# Patient Record
Sex: Female | Born: 1965 | Race: White | Hispanic: No | Marital: Married | Smoking: Never smoker
Health system: Southern US, Community
[De-identification: ages and names within clinical notes are randomized; demographics above are authoritative.]

## PROBLEM LIST (undated history)

## (undated) ENCOUNTER — Inpatient Hospital Stay (HOSPITAL_COMMUNITY): Payer: Self-pay

## (undated) DIAGNOSIS — F32A Depression, unspecified: Secondary | ICD-10-CM

## (undated) DIAGNOSIS — F319 Bipolar disorder, unspecified: Secondary | ICD-10-CM

## (undated) DIAGNOSIS — O24419 Gestational diabetes mellitus in pregnancy, unspecified control: Secondary | ICD-10-CM

## (undated) DIAGNOSIS — R55 Syncope and collapse: Secondary | ICD-10-CM

## (undated) DIAGNOSIS — G43909 Migraine, unspecified, not intractable, without status migrainosus: Secondary | ICD-10-CM

## (undated) DIAGNOSIS — R519 Headache, unspecified: Secondary | ICD-10-CM

## (undated) DIAGNOSIS — F329 Major depressive disorder, single episode, unspecified: Secondary | ICD-10-CM

## (undated) DIAGNOSIS — D649 Anemia, unspecified: Secondary | ICD-10-CM

## (undated) DIAGNOSIS — E039 Hypothyroidism, unspecified: Secondary | ICD-10-CM

## (undated) DIAGNOSIS — R51 Headache: Secondary | ICD-10-CM

## (undated) HISTORY — DX: Anemia, unspecified: D64.9

## (undated) HISTORY — DX: Syncope and collapse: R55

## (undated) HISTORY — DX: Migraine, unspecified, not intractable, without status migrainosus: G43.909

## (undated) HISTORY — DX: Hypothyroidism, unspecified: E03.9

## (undated) HISTORY — DX: Gestational diabetes mellitus in pregnancy, unspecified control: O24.419

## (undated) HISTORY — DX: Major depressive disorder, single episode, unspecified: F32.9

## (undated) HISTORY — PX: NO PAST SURGERIES: SHX2092

## (undated) HISTORY — DX: Bipolar disorder, unspecified: F31.9

## (undated) HISTORY — DX: Headache: R51

## (undated) HISTORY — DX: Depression, unspecified: F32.A

## (undated) HISTORY — PX: OTHER SURGICAL HISTORY: SHX169

## (undated) HISTORY — DX: Headache, unspecified: R51.9

---

## 1997-10-25 ENCOUNTER — Emergency Department (HOSPITAL_COMMUNITY): Admission: EM | Admit: 1997-10-25 | Discharge: 1997-10-25 | Payer: Self-pay | Admitting: Emergency Medicine

## 1997-12-12 ENCOUNTER — Emergency Department (HOSPITAL_COMMUNITY): Admission: EM | Admit: 1997-12-12 | Discharge: 1997-12-12 | Payer: Self-pay | Admitting: Emergency Medicine

## 1998-01-13 ENCOUNTER — Emergency Department (HOSPITAL_COMMUNITY): Admission: EM | Admit: 1998-01-13 | Discharge: 1998-01-13 | Payer: Self-pay

## 1998-02-23 ENCOUNTER — Emergency Department (HOSPITAL_COMMUNITY): Admission: EM | Admit: 1998-02-23 | Discharge: 1998-02-23 | Payer: Self-pay | Admitting: Emergency Medicine

## 1998-04-17 ENCOUNTER — Emergency Department (HOSPITAL_COMMUNITY): Admission: EM | Admit: 1998-04-17 | Discharge: 1998-04-17 | Payer: Self-pay | Admitting: Emergency Medicine

## 1998-06-08 ENCOUNTER — Emergency Department (HOSPITAL_COMMUNITY): Admission: EM | Admit: 1998-06-08 | Discharge: 1998-06-08 | Payer: Self-pay | Admitting: Emergency Medicine

## 1998-07-26 ENCOUNTER — Emergency Department (HOSPITAL_COMMUNITY): Admission: EM | Admit: 1998-07-26 | Discharge: 1998-07-26 | Payer: Self-pay | Admitting: Emergency Medicine

## 1998-07-30 ENCOUNTER — Encounter (HOSPITAL_COMMUNITY): Admission: RE | Admit: 1998-07-30 | Discharge: 1998-10-28 | Payer: Self-pay | Admitting: Neurology

## 1999-01-11 ENCOUNTER — Other Ambulatory Visit: Admission: RE | Admit: 1999-01-11 | Discharge: 1999-01-11 | Payer: Self-pay | Admitting: *Deleted

## 1999-03-07 ENCOUNTER — Emergency Department (HOSPITAL_COMMUNITY): Admission: EM | Admit: 1999-03-07 | Discharge: 1999-03-07 | Payer: Self-pay | Admitting: Emergency Medicine

## 1999-04-04 ENCOUNTER — Emergency Department (HOSPITAL_COMMUNITY): Admission: EM | Admit: 1999-04-04 | Discharge: 1999-04-04 | Payer: Self-pay | Admitting: Emergency Medicine

## 1999-07-14 ENCOUNTER — Emergency Department (HOSPITAL_COMMUNITY): Admission: EM | Admit: 1999-07-14 | Discharge: 1999-07-14 | Payer: Self-pay | Admitting: Emergency Medicine

## 1999-10-20 ENCOUNTER — Emergency Department (HOSPITAL_COMMUNITY): Admission: EM | Admit: 1999-10-20 | Discharge: 1999-10-21 | Payer: Self-pay | Admitting: Emergency Medicine

## 1999-11-14 ENCOUNTER — Emergency Department (HOSPITAL_COMMUNITY): Admission: EM | Admit: 1999-11-14 | Discharge: 1999-11-14 | Payer: Self-pay | Admitting: Emergency Medicine

## 1999-11-14 ENCOUNTER — Encounter: Payer: Self-pay | Admitting: Emergency Medicine

## 2000-02-01 ENCOUNTER — Other Ambulatory Visit: Admission: RE | Admit: 2000-02-01 | Discharge: 2000-02-01 | Payer: Self-pay | Admitting: *Deleted

## 2000-02-28 ENCOUNTER — Emergency Department (HOSPITAL_COMMUNITY): Admission: EM | Admit: 2000-02-28 | Discharge: 2000-02-28 | Payer: Self-pay | Admitting: Emergency Medicine

## 2000-05-31 ENCOUNTER — Emergency Department (HOSPITAL_COMMUNITY): Admission: EM | Admit: 2000-05-31 | Discharge: 2000-05-31 | Payer: Self-pay | Admitting: Emergency Medicine

## 2000-07-02 ENCOUNTER — Emergency Department (HOSPITAL_COMMUNITY): Admission: EM | Admit: 2000-07-02 | Discharge: 2000-07-02 | Payer: Self-pay | Admitting: Emergency Medicine

## 2000-08-10 ENCOUNTER — Emergency Department (HOSPITAL_COMMUNITY): Admission: EM | Admit: 2000-08-10 | Discharge: 2000-08-10 | Payer: Self-pay | Admitting: Emergency Medicine

## 2000-08-24 ENCOUNTER — Emergency Department (HOSPITAL_COMMUNITY): Admission: EM | Admit: 2000-08-24 | Discharge: 2000-08-24 | Payer: Self-pay | Admitting: Emergency Medicine

## 2000-09-30 ENCOUNTER — Emergency Department (HOSPITAL_COMMUNITY): Admission: EM | Admit: 2000-09-30 | Discharge: 2000-09-30 | Payer: Self-pay | Admitting: Emergency Medicine

## 2000-12-19 ENCOUNTER — Emergency Department (HOSPITAL_COMMUNITY): Admission: EM | Admit: 2000-12-19 | Discharge: 2000-12-19 | Payer: Self-pay | Admitting: Emergency Medicine

## 2001-07-02 ENCOUNTER — Emergency Department (HOSPITAL_COMMUNITY): Admission: EM | Admit: 2001-07-02 | Discharge: 2001-07-03 | Payer: Self-pay

## 2002-10-07 ENCOUNTER — Emergency Department (HOSPITAL_COMMUNITY): Admission: EM | Admit: 2002-10-07 | Discharge: 2002-10-08 | Payer: Self-pay | Admitting: Emergency Medicine

## 2003-01-25 ENCOUNTER — Emergency Department (HOSPITAL_COMMUNITY): Admission: EM | Admit: 2003-01-25 | Discharge: 2003-01-25 | Payer: Self-pay | Admitting: Emergency Medicine

## 2003-08-08 ENCOUNTER — Emergency Department (HOSPITAL_COMMUNITY): Admission: EM | Admit: 2003-08-08 | Discharge: 2003-08-09 | Payer: Self-pay | Admitting: Emergency Medicine

## 2003-08-09 ENCOUNTER — Inpatient Hospital Stay (HOSPITAL_COMMUNITY): Admission: RE | Admit: 2003-08-09 | Discharge: 2003-08-12 | Payer: Self-pay | Admitting: Psychiatry

## 2003-08-13 ENCOUNTER — Other Ambulatory Visit (HOSPITAL_COMMUNITY): Admission: RE | Admit: 2003-08-13 | Discharge: 2003-11-11 | Payer: Self-pay | Admitting: Psychiatry

## 2003-08-21 ENCOUNTER — Emergency Department (HOSPITAL_COMMUNITY): Admission: EM | Admit: 2003-08-21 | Discharge: 2003-08-21 | Payer: Self-pay | Admitting: Emergency Medicine

## 2005-12-17 ENCOUNTER — Emergency Department (HOSPITAL_COMMUNITY): Admission: EM | Admit: 2005-12-17 | Discharge: 2005-12-17 | Payer: Self-pay | Admitting: Emergency Medicine

## 2006-08-27 ENCOUNTER — Emergency Department (HOSPITAL_COMMUNITY): Admission: EM | Admit: 2006-08-27 | Discharge: 2006-08-27 | Payer: Self-pay | Admitting: Emergency Medicine

## 2008-05-22 ENCOUNTER — Ambulatory Visit: Payer: Self-pay | Admitting: Internal Medicine

## 2008-05-22 ENCOUNTER — Observation Stay (HOSPITAL_COMMUNITY): Admission: EM | Admit: 2008-05-22 | Discharge: 2008-05-26 | Payer: Self-pay | Admitting: Emergency Medicine

## 2008-06-13 ENCOUNTER — Telehealth: Payer: Self-pay | Admitting: Internal Medicine

## 2008-06-14 ENCOUNTER — Encounter: Payer: Self-pay | Admitting: Internal Medicine

## 2008-06-26 ENCOUNTER — Ambulatory Visit: Payer: Self-pay | Admitting: Internal Medicine

## 2008-06-26 ENCOUNTER — Encounter: Payer: Self-pay | Admitting: Internal Medicine

## 2008-06-26 DIAGNOSIS — F419 Anxiety disorder, unspecified: Secondary | ICD-10-CM | POA: Insufficient documentation

## 2008-06-26 DIAGNOSIS — F329 Major depressive disorder, single episode, unspecified: Secondary | ICD-10-CM

## 2010-01-20 ENCOUNTER — Ambulatory Visit (HOSPITAL_COMMUNITY)
Admission: RE | Admit: 2010-01-20 | Discharge: 2010-01-20 | Payer: Self-pay | Source: Home / Self Care | Attending: Psychiatry | Admitting: Psychiatry

## 2010-01-21 ENCOUNTER — Inpatient Hospital Stay (HOSPITAL_COMMUNITY)
Admission: AD | Admit: 2010-01-21 | Discharge: 2010-01-25 | Payer: Self-pay | Source: Home / Self Care | Attending: Psychiatry | Admitting: Psychiatry

## 2010-01-25 LAB — COMPREHENSIVE METABOLIC PANEL
ALT: 37 U/L — ABNORMAL HIGH (ref 0–35)
AST: 30 U/L (ref 0–37)
Albumin: 3.6 g/dL (ref 3.5–5.2)
Alkaline Phosphatase: 80 U/L (ref 39–117)
BUN: 13 mg/dL (ref 6–23)
CO2: 22 mEq/L (ref 19–32)
Calcium: 8.4 mg/dL (ref 8.4–10.5)
Chloride: 110 mEq/L (ref 96–112)
Creatinine, Ser: 0.63 mg/dL (ref 0.4–1.2)
GFR calc Af Amer: >60 mL/min (ref >60)
GFR calc non Af Amer: >60 mL/min (ref >60)
Glucose, Bld: 103 mg/dL — ABNORMAL HIGH (ref 70–99)
Potassium: 3.8 mEq/L (ref 3.5–5.1)
Sodium: 140 mEq/L (ref 135–145)
Total Bilirubin: 0.5 mg/dL (ref 0.3–1.2)
Total Protein: 6 g/dL (ref 6.0–8.3)

## 2010-01-25 LAB — CBC
HCT: 37.4 % (ref 36.0–46.0)
Hemoglobin: 12.7 g/dL (ref 12.0–15.0)
MCH: 30.6 pg (ref 26.0–34.0)
MCHC: 34 g/dL (ref 30.0–36.0)
MCV: 90.1 fL (ref 78.0–100.0)
Platelets: 270 10*3/uL (ref 150–400)
RBC: 4.15 MIL/uL (ref 3.87–5.11)
RDW: 12.5 % (ref 11.5–15.5)
WBC: 8.3 10*3/uL (ref 4.0–10.5)

## 2010-01-25 LAB — URINALYSIS, ROUTINE W REFLEX MICROSCOPIC
Bilirubin Urine: NEGATIVE
Hgb urine dipstick: NEGATIVE
Ketones, ur: NEGATIVE mg/dL
Nitrite: NEGATIVE
Protein, ur: NEGATIVE mg/dL
Specific Gravity, Urine: 1.013 (ref 1.005–1.030)
Urine Glucose, Fasting: NEGATIVE mg/dL
Urobilinogen, UA: 0.2 mg/dL (ref 0.0–1.0)
pH: 6.5 (ref 5.0–8.0)

## 2010-01-25 LAB — PREGNANCY, URINE: Preg Test, Ur: NEGATIVE

## 2010-01-25 NOTE — H&P (Addendum)
NAME:  Alyssa [name], [name] NO.:  [id number]  MEDICAL RECORD NO.:  [id number]          PATIENT TYPE:  IPS  LOCATION:  0400                          FACILITY:  [facility]  PHYSICIAN:  Anselm Jungling, MD  DATE OF BIRTH:  [birth year]  DATE OF ADMISSION:  01/21/2010 DATE OF DISCHARGE:                      PSYCHIATRIC ADMISSION ASSESSMENT   TIME:  8:15 a.m.  IDENTIFICATION:  A 45 year old female.  This is a voluntary admission.  HISTORY OF PRESENT ILLNESS:  Second or third [facility] admission for Alyssa [name] [name], who presents complaining of some mood irritability and thinks she needs to have some labs done.  She is not exactly sure why she is here.  Says that she has difficulty remembering to take her medications and also reported that she gets stressed out and had a meltdown at work today and says she cannot go back there.  She is denying any suicidal or homicidal thought.  She had presented accompanied by her fiance, who was concerned about her leaving the house in the middle the night, looking for her 2 children.  However, she has no children.  She claims that she has problems with poor memory, secondary to taking high doses of Topamax for 15 years for migraine headaches.  PAST PSYCHIATRIC HISTORY:  Currently followed as an outpatient by Dr. Archer Asa at [facility].  She has a history of a previous admission for depression at [facility] in 2005.  She reports previously she was on Abilify, which was stopped several months ago.  She is not aware of any intervening hospitalizations.  No history of substance abuse.  SOCIAL HISTORY:  Single Caucasian female, lives with her fiance for the past 3 years and reports a good relationship, he is supportive.  Father and stepmother also live nearby and are supportive.  She has been working at a [employer].  FAMILY HISTORY:  Sister with depression.  MEDICAL HISTORY:  No regular primary care provider.  Medical  problems are none.  MEDICATIONS: 1. Effexor XR 150 mg twice daily, not taking regularly. 2. Amitriptyline 100 mg at bedtime. 3. Sprintec oral contraceptives. 4. Anaprox and Imitrex for migraine headaches.  DRUG ALLERGIES:  KETORALAC, DROPERIDOL, CODEINE.  PHYSICAL EXAM:  Done here on the unit and is noted in the record.  CMET and CBC are normal.  Other basic labs are currently pending.  This is a petite built, normally-developed, well-hydrated female with no abnormal movements and denying any chronic medical conditions.  MENTAL STATUS EXAM:  Fully alert female, pleasant, cooperative, appears in full contact with reality.  Scattered eye contact.  Somewhat irritable affect.  Acts pained and impatient that she has to answer questions or give any type of history.  No evidence of psychosis. Thinking generally logical.  Denying any suicidal or homicidal thought. Cognitively, she is intact and oriented x3.  AXIS I:  Depressive disorder, not otherwise specified. AXIS II:  Deferred. AXIS III:  No diagnosis. AXIS IV:  Recent work conflict and chronic stressors. AXIS V:  Current 48.  Past year not known.  PLAN:  Voluntarily admit her.  Will get in touch and hear from her  family.  Meanwhile, we will continue her Effexor at 150 mg q.a.m., rather than twice daily, since she reports she is not taking it regularly.     Margaret A. Lorin Picket, N.P.   ______________________________ Anselm Jungling, MD    MAS/MEDQ  D:  01/22/2010  T:  01/22/2010  Job:  [id number]  Electronically Signed by Kari Baars N.P. on 01/22/2010 11:41:59 AM Electronically Signed by Geralyn Flash MD on 01/25/2010 10:59:45 AM

## 2010-01-26 ENCOUNTER — Other Ambulatory Visit (HOSPITAL_COMMUNITY)
Admission: RE | Admit: 2010-01-26 | Discharge: 2010-02-02 | Payer: Self-pay | Source: Home / Self Care | Attending: Psychiatry | Admitting: Psychiatry

## 2010-01-26 LAB — URINE DRUGS OF ABUSE SCREEN W ALC, ROUTINE (REF LAB)
Amphetamine Screen, Ur: NEGATIVE
Cocaine Metabolites: NEGATIVE
Ethyl Alcohol: <10 mg/dL (ref <10)
Opiate Screen, Urine: NEGATIVE
Propoxyphene: NEGATIVE

## 2010-01-29 NOTE — Discharge Summary (Addendum)
  NAME:  Alyssa [name] NO.:  [id number]  MEDICAL RECORD NO.:  [id number]         PATIENT TYPE:  BIPS  LOCATION:  0400                          FACILITY:  [facility]  PHYSICIAN:  Alyssa Jungling, MD  DATE OF BIRTH:  1965/07/13  DATE OF ADMISSION:  01/21/2010 DATE OF DISCHARGE:  01/25/2010                              DISCHARGE SUMMARY   IDENTIFYING DATA/REASON FOR ADMISSION:  This was an inpatient psychiatric admission for Alyssa [name], a 45 year old single female who was admitted because of psychotic symptoms.  Please refer to the admission. Further details pertaining to the symptoms, circumstances, and history that led to her hospitalization.  She was given an initial Axis I diagnosis of psychosis, not otherwise specified.  MEDICAL AND LABORATORY:  The patient was medically and physically assessed by the psychiatric nurse practitioner.  She was in good health without active or chronic medical problems.  Urine drug screen was negative.  There were no significant medical issues during her stay.  HOSPITAL COURSE:  The patient was admitted to the adult inpatient psychiatric service.  She presented as a well-nourished, normally- developed adult female who was generally pleasant and cooperative.  She had been brought in after wandering in a confused state in her neighborhood during the evening hours.  She came to [place] as a patient of Dr. Archer [name], psychiatrist, and Alyssa [name], therapist.  She came to [place] on a regimen of Alyssa [name] 150 mg b.i.d. and amitriptyline 100 mg daily.  Initially, she was placed on 1-to-1 staffing because of her level of confusion, but she cleared very quickly.  She remained in the inpatient service for 4 days.  At that time, she was a good participant in the treatment program.  She attended therapeutic groups and activities geared towards helping her acquire a better set of coping skills, a better understanding of her underlying disorder and  dynamics, and the development of an aftercare plan.  The patient, who lives with her fiance, gave [place] permission to contact the fiance.  On the fourth hospital day, the patient and her fiance and the treatment team were in agreement as her appropriateness for discharge and return to outpatient care.  Although the undersigned had placed a call to Alyssa [name], I did not receive a return call from him during her stay.  AFTERCARE:  The patient was to follow-up with Alyssa [name] and with Alyssa [name], times to be determined at the time of this dictation.  The patient was instructed to continue on her same regimen of medications as prior to her admission.     Alyssa Jungling, MD    SPB/MEDQ  D:  01/26/2010  T:  01/26/2010  Job:  [id number]  Electronically Signed by Geralyn Flash MD on 02/08/2010 10:47:53 AM

## 2010-02-03 ENCOUNTER — Other Ambulatory Visit (HOSPITAL_COMMUNITY): Payer: BC Managed Care – PPO | Attending: Psychiatry | Admitting: Psychiatry

## 2010-02-03 DIAGNOSIS — F3289 Other specified depressive episodes: Secondary | ICD-10-CM | POA: Insufficient documentation

## 2010-02-03 DIAGNOSIS — F329 Major depressive disorder, single episode, unspecified: Secondary | ICD-10-CM | POA: Insufficient documentation

## 2010-02-04 ENCOUNTER — Other Ambulatory Visit (HOSPITAL_COMMUNITY): Payer: BC Managed Care – PPO | Admitting: Psychiatry

## 2010-02-05 ENCOUNTER — Other Ambulatory Visit (HOSPITAL_COMMUNITY): Payer: BC Managed Care – PPO | Admitting: Psychiatry

## 2010-02-08 ENCOUNTER — Other Ambulatory Visit (HOSPITAL_COMMUNITY): Payer: BC Managed Care – PPO | Admitting: Psychiatry

## 2010-02-09 ENCOUNTER — Other Ambulatory Visit (HOSPITAL_COMMUNITY): Payer: BC Managed Care – PPO | Admitting: Psychiatry

## 2010-02-10 ENCOUNTER — Other Ambulatory Visit (HOSPITAL_COMMUNITY): Payer: BC Managed Care – PPO | Admitting: Psychiatry

## 2010-02-11 ENCOUNTER — Encounter: Payer: Self-pay | Admitting: Internal Medicine

## 2010-02-11 ENCOUNTER — Other Ambulatory Visit (HOSPITAL_COMMUNITY): Payer: BC Managed Care – PPO | Admitting: Psychiatry

## 2010-02-12 ENCOUNTER — Other Ambulatory Visit (HOSPITAL_COMMUNITY): Payer: BC Managed Care – PPO | Admitting: Psychiatry

## 2010-02-15 ENCOUNTER — Other Ambulatory Visit (HOSPITAL_COMMUNITY): Payer: BC Managed Care – PPO | Admitting: Psychiatry

## 2010-02-16 ENCOUNTER — Other Ambulatory Visit (HOSPITAL_COMMUNITY): Payer: BC Managed Care – PPO | Admitting: Psychiatry

## 2010-02-17 ENCOUNTER — Other Ambulatory Visit (HOSPITAL_COMMUNITY): Payer: BC Managed Care – PPO | Admitting: Psychiatry

## 2010-02-18 ENCOUNTER — Other Ambulatory Visit (HOSPITAL_COMMUNITY): Payer: BC Managed Care – PPO | Admitting: Psychiatry

## 2010-02-19 ENCOUNTER — Other Ambulatory Visit (HOSPITAL_COMMUNITY): Payer: BC Managed Care – PPO | Admitting: Psychiatry

## 2010-02-22 ENCOUNTER — Other Ambulatory Visit (HOSPITAL_COMMUNITY): Payer: BC Managed Care – PPO | Admitting: Psychiatry

## 2010-02-23 ENCOUNTER — Other Ambulatory Visit (HOSPITAL_COMMUNITY): Payer: BC Managed Care – PPO | Admitting: Psychiatry

## 2010-04-13 LAB — RAPID URINE DRUG SCREEN, HOSP PERFORMED
Opiates: NOT DETECTED
Tetrahydrocannabinol: NOT DETECTED

## 2010-04-13 LAB — TSH: TSH: 2.411 u[IU]/mL (ref 0.350–4.500)

## 2010-04-13 LAB — BASIC METABOLIC PANEL
BUN: 17 mg/dL (ref 6–23)
CO2: 28 mEq/L (ref 19–32)
Calcium: 9.2 mg/dL (ref 8.4–10.5)
GFR calc Af Amer: >60 mL/min (ref >60)
GFR calc non Af Amer: >60 mL/min (ref >60)
GFR calc non Af Amer: >60 mL/min (ref >60)
Glucose, Bld: 103 mg/dL — ABNORMAL HIGH (ref 70–99)
Potassium: 3.8 mEq/L (ref 3.5–5.1)
Potassium: 3.9 mEq/L (ref 3.5–5.1)
Sodium: 140 mEq/L (ref 135–145)

## 2010-04-13 LAB — COMPREHENSIVE METABOLIC PANEL
ALT: 17 U/L (ref 0–35)
Alkaline Phosphatase: 83 U/L (ref 39–117)
CO2: 24 mEq/L (ref 19–32)
Chloride: 107 mEq/L (ref 96–112)
GFR calc non Af Amer: >60 mL/min (ref >60)
Glucose, Bld: 97 mg/dL (ref 70–99)
Potassium: 4.1 mEq/L (ref 3.5–5.1)
Sodium: 139 mEq/L (ref 135–145)

## 2010-04-13 LAB — DIFFERENTIAL
Lymphocytes Relative: 25 % (ref 12–46)
Monocytes Absolute: 0.6 10*3/uL (ref 0.1–1.0)
Monocytes Relative: 6 % (ref 3–12)
Neutro Abs: 6 10*3/uL (ref 1.7–7.7)

## 2010-04-13 LAB — CBC
HCT: 39.5 % (ref 36.0–46.0)
Hemoglobin: 14.2 g/dL (ref 12.0–15.0)
Platelets: 264 10*3/uL (ref 150–400)
RBC: 4.5 MIL/uL (ref 3.87–5.11)
RDW: 12.8 % (ref 11.5–15.5)

## 2010-04-13 LAB — TRICYCLICS SCREEN, URINE: TCA Scrn: POSITIVE — AB

## 2010-05-18 NOTE — Consult Note (Signed)
NAME:  Alyssa [name], DERSHEM NO.:  [id number]   MEDICAL RECORD NO.:  [id number]          PATIENT TYPE:  OBV   LOCATION:                               FACILITY:  [facility]   PHYSICIAN:  Antonietta Breach, M.D.  DATE OF BIRTH:  March 31, 1965   DATE OF CONSULTATION:  05/26/2008  DATE OF DISCHARGE:                                 CONSULTATION   Ms. Alyssa [name] is displaying coherent thought process.  She is cooperative  and socially appropriate.  She still has slight feeling on edge;  however, she is oriented completely at all spheres.  Her memory function  is intact.  She has constructive future goals and interests.  Her  concentration is 90% of normal.  She is not having any thoughts of  harming herself or others.  She has no delusions or hallucinations.  She  has no pressured speech.   GROUP REVIEW OF SYSTEMS:  NEUROLOGIC:  No stiffness or other  extrapyramidal signs from the Zyprexa.   MENTAL STATUS EXAMINATION:  Please see the above.  She is alert.  Her  eye contact is good.  Her speech is within normal limits.  Her  concentration is within normal limits.  Attention span is normal.  Her  affect is broad and appropriate.  Her mood is slightly anxious.  Her  fund of knowledge and intelligence are normal.  Thought process is  logical, coherent, goal directed.  No looseness of association.  Thought  content, no thoughts of harming herself or others.  No delusions or  hallucinations.  Her insight is intact.  Her judgment is intact.   Temperature is 98.0, pulse 76, respiratory rate 18, blood pressure  101/65.   ASSESSMENT:  AXIS I:  293.00 delirium not otherwise specified, now clear  and in remission.  AXIS II:  96.80 bipolar disorder, not otherwise specified, stable.   Ms. Alyssa [name] is not at risk to harm herself or others.  She agrees to call  emergency services immediately for any thoughts of harming herself or  others or other psychiatric emergency symptoms.   We undersigned,  reinforced her necessary ongoing treatment including her  maintenance medication and psychiatric followup.   RECOMMENDATIONS:  No change in her Effexor, her antidepression,  antianxiety.   We would continue to titrate her Lamictal by 25 mg per week to the  initial trial dose for long-term stabilization at 100 mg per day.   Ms. Alyssa [name] has been informed regarding Lamictal, understands the risk of  a lethal rash and other side effects and wants to proceed with Lamictal  after comparing it to other mood stabilizers.   It is hoped that she will be able to taper off her Zyprexa with time.  She does understand that the Zyprexa is necessary for now in order to  prevent a regression back into psychosis.  She understands the risk of a  nonreversible movement and weight gain.  She understands that the goal  is to try her off the Zyprexa once the Lamictal is at the point that it  could be effective.  She agrees to  not drive if drowsy.   We would ask the social worker to set her up with a psychiatric  outpatient followup within the first week of discharge.      Antonietta Breach, M.D.  Electronically Signed     JW/MEDQ  D:  06/22/2008  T:  06/23/2008  Job:  [id number]

## 2010-05-18 NOTE — Consult Note (Signed)
NAME:  TABOR, BARTRAM NO.:  [id number]   MEDICAL RECORD NO.:  [id number]          PATIENT TYPE:  OBV   LOCATION:  3730                         FACILITY:  [facility]   PHYSICIAN:  Antonietta Breach, M.D.  DATE OF BIRTH:  [birth year]   DATE OF CONSULTATION:  05/22/2008  DATE OF DISCHARGE:                                 CONSULTATION   REQUESTING PHYSICIAN:  [facility].   REASON FOR CONSULTATION:  Psychosis.   HISTORY OF PRESENT ILLNESS:  Ms. Alyssa [name] is a 45 year old female who  presented to the [facility] with psychosis, confusion, waxing and  waning disorientation along with waxing and waning memory function.   Earlier in the day she barricaded herself in the bathroom to have a  private conversation with no one in the room.  She has been trying to  crawl through windows.   The patient was coherent enough and did have enough insight with  orientation to recognize both her father and mother.  She gave  permission for them to be included in the session to help provide  history and facilitate support.   Ms. Alyssa [name] does have a past psychiatric history of a form of bipolar  disorder.  However, 2 weeks ago she began to display mental symptoms  that involved many symptoms she had never had before.  Over the past 2  weeks she has displayed waxing and waning memory dysfunction,  orientation dysfunction and clouding of consciousness.  She has had  periods of severe expansive and angry mood with vandalism to objects in  the room.  She has demonstrated some pressured speech, increased rate of  thought process as well as some thought disorganization.  She has  demonstrated uncharacteristic visual hallucinations off and on.   She did go to see her pain doctor 2 weeks ago.  At the time she arrived  she was on her standing psychotropic medication which included Topamax  300 mg daily, Effexor XR 300 mg daily and Elavil 100 mg daily.   It was also noted that  she had been receiving Demerol 50 mg tablet.  She  was to take one as needed for headache.  However, she may have taken as  much as six at one time in order to relieve headache.   At her pain physician's visit she was told to begin a slow taper off of  her Topamax over 3 weeks.  She did not begin that taper.  Also her pain  specialist prescribed Depakote.  She did not take the Depakote.   She presented to an outpatient psychiatrist 2 days prior to her  presentation to [facility] (May 19, 2008).  At that time the  psychiatrist told her to start her Depakote (which she had not yet  started) at 500 mg daily.  He also prescribed Lamictal to start 25 mg  p.o. every other day and then proceed with the typical titration.   She has received one dose of 25 mg Lamictal and one dose of 500 mg  Depakote.  She has continued on her Topamax, her amitriptyline and her  Effexor as above.   She does continue with the waxing and waning memory dysfunction,  disorientation.  During the undersigned's visit she does have thought  disorganization mixed with periods of thought coherence.  She has mild  clouding of consciousness.  She is not agitated or combative.  She is  redirectable.  At times she smiles inappropriately.   PAST PSYCHIATRIC HISTORY:  The past medical record is reviewed.  She  does have a history of a psychiatric admission in August of 2005 at the  [facility].  At that time she was complaining of  depression.  She had suicidal ideation.  She was noting that she had  undergone a divorce in 2003.  It was noted at that time that she had  completed a mood questionnaire and that the questionnaire was suggestive  of bipolar disorder.  However, the patient was vehemently denying the  diagnosis.  Her medications at that time on presentation were Wellbutrin  150 mg daily, Lunesta 2 mg q.h.s.  She was discharged on Topamax 100 mg  b.i.d., Wellbutrin XL 300 mg daily, Lunesta 2 mg  q.h.s.  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   Ms. Alyssa [name], as well as her parents combined, give a history of expansive  elevated mood periods that last no longer than 3 days and have occurred  approximately every 6 months starting in her early 34s.  During these  times her speech rate will go up.  Her thought process speed will  increase.  Her energy will rise.  Her mood will become expansive.  She  will have grandiosity and a high sense of entitlement.  She also will  show decreased need for sleep.  She will express delusions that  typically involve things such as her father showing favor to her other  sister.  She has been diagnosed with bipolar disorder.   FAMILY PSYCHIATRIC HISTORY:  There are indirect reports that her father  may have a form of bipolar disorder.  This history was provided by her  fiance to the general medical admitting physician.  There is also a note  in the past medical record that states that her sister has bipolar  disorder.   SOCIAL HISTORY:  She has a sister.  She was very successful with dance  at a young age and attended [employer] initially.  She did have 2  years of college then her illness progressed.  She has worked as a Social worker  at times.  She currently works at [employer].  She is engaged to be  married.  There is no use of illegal drugs or alcohol.   PAST MEDICAL HISTORY:  Usual childhood illnesses, migraine headaches  that have been quite severe.   MEDICATIONS:  The MAR is reviewed.  Please see the discussion above.   ALLERGIES:  She has allergies to CODEINE and DROPERIDOL.   LABORATORY DATA:  TSH normal.  Sodium 139, BUN 12, creatinine 0.73,  glucose 97, SGOT 18, SGPT 17, a Depakote level was 42.5, alcohol  negative.  WBC 9.0, hemoglobin 14.2, platelet count 264, TCA positive,  drug screen was negative.   Head CT without contrast showed no acute intracranial abnormalities.   REVIEW OF SYSTEMS:  Constitutional,  head, eyes, ears, nose, throat,  mouth, neurologic, psychiatric, cardiovascular, respiratory,  gastrointestinal, genitourinary, skin, musculoskeletal, hematologic,  lymphatic, endocrine and metabolic all unremarkable.   PHYSICAL EXAMINATION:  VITAL SIGNS:  Temperature 97.8, pulse 106,  respiratory rate  16, blood pressure 109/60.  GENERAL APPEARANCE:  Ms. Alyssa [name] is sitting in her hospital gurney,  sometimes lying down, sometimes sitting up.  She does not demonstrate  any abnormal involuntary movements.  However, she clearly is restless.  MENTAL STATUS EXAM:  Ms. Alyssa [name] does have decreased attention.  She has  intermittent eye contact.  Her concentration is decreased.  Her affect  is mildly anxious at baseline.  She will have inappropriate smiles at  times.  Mood is mildly labile.  On orientation testing she has  difficulty with the year and the month.  She also has patchy memory for  recent days.  Her fund of knowledge and intelligence are below that of  her estimated premorbid baseline.  Her speech involves some pressure at  times.  Please see thought process.  Thought process, she will reverse  words and phrases at times such as instead of stating that my mother  talks nonstop she states my mother stops nontalking.  Thought  content, she does appear to have internal stimulation at times.  There  are no thoughts of harming herself or others.  Her insight is poor.  Judgment is impaired.   ASSESSMENT:  AXIS I:  1. 293.00 delirium not otherwise specified.  2. 296.80 bipolar disorder not otherwise specified.  AXIS II:  Deferred.  AXIS III:  See past medical history.  AXIS IV:  General medical.  AXIS V:  20.   Clearly Ms. Alyssa [name] does have impaired judgment and would be at risk for  passive lethal self-neglect if outside of an institution.   Therefore the undersigned does recommend that she be hospitalized at the  current level of medical clearance, either a general medical floor or a   psychiatric floor would be appropriate.   The undersigned did provide low stimulation ego support along with  further education with the parents.   The indications, alternatives and adverse effects of proceeding with  Zyprexa were discussed given that her EKG QTC was 483.  The parents  understand and would like to proceed as below.   Would start Zyprexa 5 mg p.o. or IM q.h.s. for antipsychosis and acute  mood stabilization.  Would continue the Effexor.  However, would lower  the dose to 225 mg daily for antidepression.  Would remove the Topamax.  Would avoid opioid medication and Demerol.  Would discontinue the  Elavil.  Would discontinue the Depakote.  Would proceed with Lamictal 25 mg daily which will be her primary mood  stabilizer if effective and can be titrated in the usual fashion slowly  over 4 weeks, reaching a target dose of 100 mg daily at the fourth week.   In the meantime Zyprexa can be utilized as an antipsychotic and acute  mood stabilizer.   Would monitor for stiffness or other extrapyramidal side effects with  the Zyprexa and would recheck an EKG tomorrow.  If she continues to  remain below 500 milliseconds on her QTC would increase her Zyprexa as  tolerated to 10 mg daily as the initial trial dose for antipsychosis,  antiagitation and mood stabilization.   Would provide low stimulation ego support and continue with memory and  orientation cues in the room.   Once her organic delirium mental status portion clears, if she still  displays any classic manic features or severe depression she will need  to be admitted to an inpatient psychiatric unit.      Antonietta Breach, M.D.  Electronically Signed     JW/MEDQ  D:  05/23/2008  T:  05/23/2008  Job:  [id number]

## 2010-05-18 NOTE — Discharge Summary (Signed)
NAME:  Alyssa [name], CUMBO NO.:  [id number]   MEDICAL RECORD NO.:  [id number]          PATIENT TYPE:  OBV   LOCATION:  3006                         FACILITY:  [facility]   PHYSICIAN:  Madaline Guthrie, M.D.    DATE OF BIRTH:  1965/08/28   DATE OF ADMISSION:  05/21/2008  DATE OF DISCHARGE:  05/26/2008                               DISCHARGE SUMMARY   DISCHARGE DIAGNOSES:  1. Altered mental status - secondary to polypharmacy, multiple      psychiatric medications.  2. History of major depression.  3. History of bipolar disorder.  4. History of migraine headaches.   DISCHARGE MEDICATIONS:  1. Zyprexa 5-mg tablet take 1 tablet at night time.  2. Lamictal 25-mg tablet take 1 tablet daily in the morning.  3. Effexor 300-mg tablet take 1 tablet at nighttime.   DISPOSITION AND FOLLOW-UP:  The patient was discharged from the hospital  in stable condition with altered mental status resolved and thought to  be most likely secondary to psychiatric medication polypharmacy.  The  patient was cleared by Dr. Jeanie Sewer for discharge home as opposed to  [facility].  The patient will follow up in 1 week with Dr.  Tomasa Rand, her psychiatrist.  On follow-up appointment, per Dr.  Jeanie Sewer, recommendation is to increase Lamictal weekly by 25 mg dose  up to 100 mg total to be taken daily in the morning.  Discharge  paperwork will be faxed to Dr. Tomasa Rand.   PROCEDURES:  CT of the head without contrast May 21, 2008.  No acute  intracranial abnormalities, hemorrhages, masses, lesions or infarctions.   CONSULTANTS:  Dr. Jeanie Sewer, psychiatry.   HISTORY OF PRESENT ILLNESS:  The patient is a 45 year old female with  past medical history of depression and bipolar disorder, chronic  migraine headaches who presented to [facility] ED with 3-day history of  confusion, mostly auditory hallucinations associated with paranoid  behavior, searching for hidden cameras in the house,  believes that  everybody is talking about her, talking to herself in the bathroom.  Per  her fiance, this is new behavior for her, started beginning of May at  which point the patient was also started on Depakote and Lamictal.  Per  her psychiatrist, she was supposed to be on Topamax taper which has  never been tapered per the patient.  About three days prior to  admission, the patient was given Demerol for migraines to take p.o. as  needed.  The patient denies alcohol and drug use.  No obvious concerns  or systemic symptoms.   VITALS:  Temperature 97.8, blood pressure 125/88, pulse 109,  respirations 20, saturating 100% on room air.   PHYSICAL EXAMINATION:  GENERAL:  Not in acute distress.  EYES:  EOMI, PERRLA, 5 mm in diameter.  No icterus.  No pallor.  Moist  mucous membranes.  Oropharynx clear.  NECK:  Supple.  HEAD:  Atraumatic.  LUNGS: Clear to auscultation bilaterally.  Good air movement.  CARDIOVASCULAR:  Regular rate and rhythm.  No murmurs, rubs or gallops.  ABDOMEN:  Soft, nontender, nondistended with bowel sounds  present.  EXTREMITIES:  No edema or calf swelling.  NEUROLOGICALLY:  Alert and oriented x3, nonfocal.  PSYCH:  Mild paranoia, racing thoughts, no pressured speech, no  tangential thoughts.  No suicidal or homicidal ideations.  Normal  affect.  Normally interactive.   LABORATORY DATA:  WBC 9, hemoglobin 14.2, platelets 264.  Sodium 140,  potassium 3.9, chloride 107, bicarb 28, glucose 109, BUN 14, creatinine  0.79, calcium 9.2.  LFTs:  Bilirubin 0.7, alkaline phosphatase 83, AST  18, ALT 17, total protein 6.8, albumin 4.2, calcium 9.3.   PROBLEM LIST:  1. Altered mental status, most likely secondary to psychiatric      medication polypharmacy.  The patient was started recently on      Depakote and Lamictal as well as Demerol for migraines.  On      admission, did exhibit some paranoia with hallucinations per      nursing staff.  Topamax, Depakote and  amitriptyline were all      stopped as well as Demerol.  The patient was started on Zyprexa,      Lamictal and Effexor.  Mental status improved significantly by the      hospital day #3, and on discharge, the patient appeared to be at      her baseline.  Per Dr. Providence Crosby initial evaluation, it was      recommended for the patient to be transferred to [facility].  However, due to clearing of mental status, no      hallucinations, no paranoia, no pressured speech, it was determined      that the patient is safe to go home with current medication      regimen, and follow up in 1 week is recommended with her current      psychiatrist, Dr. Tomasa Rand.  In addition, during the admission,      the patient had slight prolongation of QT interval, but this had      resolved by hospital day #2.  UDS was clean.  Urine tricyclic      screen showed detectable tricyclic.  Alcohol levels undetected.      TSH 2.411 within normal limits.   VITALS ON DISCHARGE:  Temperature 98, pulse 76, respirations 18, blood  pressure 101/65, saturating 100% on room air.   LABORATORY DATA ON DISCHARGE:  Last labs drawn on May 21:  CBC - WBC  7.7, hemoglobin 13.4, platelets 264.  Sodium 140, potassium 3.8,  chloride 107, bicarb 24, glucose 103, BUN 17, creatinine 0.82, calcium  9.2.   Over 30 minutes was spent on discharging the patient.      Mliss Sax, MD  Electronically Signed      Madaline Guthrie, M.D.  Electronically Signed    IM/MEDQ  D:  05/26/2008  T:  05/26/2008  Job:  [id number]

## 2010-05-21 NOTE — H&P (Signed)
NAME:  MONEA, PESANTEZ NO.:  [id number]   MEDICAL RECORD NO.:  [id number]                   PATIENT TYPE:  IPS   LOCATION:  0501                                 [facility]  [facility]   PHYSICIAN:  Jeanice Lim, M.D.              DATE OF BIRTH:  09-07-1965   DATE OF ADMISSION:  08/09/2003  DATE OF DISCHARGE:                         PSYCHIATRIC ADMISSION ASSESSMENT   PATIENT IDENTIFICATION:  This is a voluntary admission to Jeanice Lim,  M.D.  This is a 45 year old divorced white female.   HISTORY OF PRESENT ILLNESS:  Apparently, she presented to the emergency room  yesterday.  She was tired of being depressed.  She has had suicidal ideation  on and off with no active plan and she felt she needed to be hospitalized to  move the process along.  She denies any problems with depression prior to  her divorce in 2003.  Her sister is bipolar and is treated.  Her mood  disorders questionnaire was suggestive of bipolar illness but she vehemently  denies this.  She also is actively followed by Dr. Donnetta Simpers, the  neurologist in [place], for migraines.   PAST PSYCHIATRIC HISTORY:  She states that she began as an outpatient in  [place] with no improvement.  In 2003-2004, she saw a Iverson Alamin, again  with no improvement.  She is currently seeing Meredith Staggers, M.D., and again  has not had a marked response to treatment to date.   SUBSTANCE ABUSE HISTORY:  She denies using alcohol, smoking, or any street  drugs.   PAST MEDICAL HISTORY:  Primary care [facility]: Dr. Donnetta Simpers is her  neurologist.  She is treated for migraines.   CURRENT MEDICATIONS:  1. Ketoprofen 75 mg at onset of migraine to be repeated q.4h.  2. Metoclopramide 5 mg again at onset of migraine to be repeated q.4h.  3. Topamax 100 mg b.i.d.  4. Wellbutrin 150 mg p.o. daily.  5. Norvasc 10 mg p.o. daily.  6. Lunesta 2 mg at h.s.   DRUG ALLERGIES:  TORADOL, CODEINE, and DROPERIDOL.   She states that she gets  a rash as well as EPS with this.   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  We do have a TSH pending on her.  No other  remarkable physical findings.  Her exam is well documented on the chart from  the ER.   SOCIAL HISTORY:  She had two years of college.  She is currently employed as  a Social worker.   FAMILY HISTORY:  She states her sister is bipolar, no one else.   MENTAL STATUS EXAM:  She is alert and oriented x 3.  She is appropriately  groomed, casually dressed.  Her speech is not pressured.  She is anxious.  She is irritable.  Her affect is congruent.  She is somewhat controlling.  She acknowledges suicidal ideation at times, not today.  She specifically  denies delusions, paranoia, auditory and visual hallucinations.  She defers  back to thoughts about her divorce, I'm not dumb, blah, blah, blah; this  kind of a thing.  Judgment and insight are fair.  Concentration and memory  are intact.  Intelligence is at least average.  She states she has no joy in  her life and basically that is why she is here.  She wants to get joy back  in her life.  She states she has been on over 45 medications without effect.   ADMISSION DIAGNOSES:   AXIS I:  1. Major depressive disorder, recurrent with suicidal ideation.  2. Rule out bipolar II, depressed.   AXIS II:  Rule out personality disorder.   AXIS III:  Migraines.   AXIS IV:  Status post divorce two years ago.   AXIS V:   INITIAL PLAN OF CARE:  The plan is to admit for safety, to optimize her  medications.  We will try to identify a therapist here in [place] and if  possible start therapy here in the hospital if this therapist has hospital  privileges.  We will also try to get testing done to evaluate for  personality disorder.  We can increase her Wellbutrin today and  further  medication adjustments will be at the discretion of the attending, Syed T.  Lolly Mustache, M.D.     Vic Ripper, P.A.-C.               Jeanice Lim, M.D.    MD/MEDQ  D:  08/09/2003  T:  08/09/2003  Job:  [id number]

## 2010-05-21 NOTE — Discharge Summary (Signed)
NAME:  Alyssa [name], Alyssa [name] NO.:  [id number]   MEDICAL RECORD NO.:  [id number]                   PATIENT TYPE:  IPS   LOCATION:  0501                                 [facility]ACILITY[facility]  [facility]   PHYSICIAN:  Geoffery Lyons, M.D.                   DATE OF BIRTH:  01-Apr-1965   DATE OF ADMISSION:  08/09/2003  DATE OF DISCHARGE:  08/12/2003                                 DISCHARGE SUMMARY   CHIEF COMPLAINT AND PRESENT ILLNESS:  This was the first admission to [facility] for this 45 year old divorced white female.  Presented to the emergency room.  Claimed that she was tired of being  depressed, having suicidal ideation on and off with no active plan.  She  felt she needed to be hospitalized to move the process along.  Denies any  problem with depression prior to her divorce in 2003.  Her sister is  bipolar, mistreated.  Also followed by Dr. Donnetta Simpers, a neurologist, for  migraines.   PAST PSYCHIATRIC HISTORY:  Outpatient in [place] with no improvement in  2003 and 2004.  Currently sees Dr. Meredith Staggers.  He has had no more  response to treatment to date.   SUBSTANCE ABUSE HISTORY:  Denies the use or abuse of any substances.   PAST MEDICAL HISTORY:  Dr. Donnetta Simpers, neurologist, for migraines.   MEDICATIONS:  Ketoprofen 25 mg at onset of migraine, metoclopramide 5 mg at  onset of migraine, Topamax 100 mg twice a day, Wellbutrin 150 mg daily,  Norvasc 10 mg daily, Lunesta 2 mg at bedtime.   PHYSICAL EXAMINATION:  Performed and failed to show any acute findings.   LABORATORY DATA:  Blood chemistries with chloride 116, glucose 102.  SGOT  20, SGPT 23.  TSH 5.513.   MENTAL STATUS EXAM:  Upon admission revealed an alert, oriented female  appropriately groomed, casually dressed.  Speech was not pressure.  She was  anxious, irritable.  Her affect was congruent.  Somewhat controlling and  suicidal ideation.  Denies delusions, paranoia, auditory or visual  hallucinations.  She defers back to thoughts about her divorce.  Judgment  and insight were fair.  Concentration and memory were intact.  Intelligence  is at least average.  Feeling that she is not enjoying her life.  Basically,  that is why she is there.  Claimed that she has been on quite a few  medications without effect.   ADMISSION DIAGNOSES:   AXIS I:  1.  Major depressive disorder.  2.  Rule out bipolar disorder, type 2, depressed.   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   AXIS III:  Migraines.   AXIS IV:  Moderate.   AXIS V:  Global Assessment of Functioning upon admission 25-30; highest  Global Assessment of Functioning in the last year 65.   HOSPITAL COURSE:  She was admitted  and started intensive individual and  group psychotherapy.  She was given Ambien for sleep.  Replaced potassium.  She was maintained on Topamax 100 mg daily, Wellbutrin 150 mg daily, Norvasc  10 mg daily.  She was maintained on Lunesta, Ketoprofen, metoclopramide.  On  August 08, 2003, she was less depressed, still vague about suicidal ideation.  Concerned about her medication and the migraine headaches.  Speech was  monotone, superficial and guarded.  She was tolerating Wellbutrin, adjusted  the dose.  Starting to sleep better.  On August 11, 2003, she said she was  better.  She was, indeed, more insightful.  Decided to let go of her job and  go back to school.  Said she wanted some other options for herself.  When  she made the decision to leave her job, she claimed that her stress  decreased.  Endorsed supportive relationship with the boyfriend.  Having a  hard time letting go of the relationship with the husband.  She continued to  open up and process individually and in group.  On August 9th, she was in  full contact with reality.  She denied any suicidal or homicidal ideation.  Denies hallucinations or delusions.  She felt that she was ready to address  some of the issues she  had.  Identified what caused increased depression.  Will not go back to her job.  Will go back to school.  Will work on herself.  Continue the relationship with the boyfriend, who was supportive.  Had  worked on Counsellor.  She was stable enough that  we discharged to outpatient follow-up.   DISCHARGE DIAGNOSES:   AXIS I:  Major depression, recurrent.   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   AXIS III:  Migraines.   AXIS IV:  Moderate.   AXIS V:  Global Assessment of Functioning upon discharge 55-60.   DISCHARGE MEDICATIONS:  1.  Norvasc 10 mg daily.  2.  Topamax 100 mg twice a day.  3.  Wellbutrin XL 300 mg daily.  4.  Lunesta 2 mg at night.  5.  Nortrel 7/7/7 every 24 hours.  6.  Orudis 75 mg every four hours.  7.  Reglan 5 mg every four hours as needed.   FOLLOW UP:  [facility] and Dr. Jennelle Human on  September 18, 2003.                                               Geoffery Lyons, M.D.    IL/MEDQ  D:  09/03/2003  T:  09/05/2003  Job:  [id number]

## 2010-08-05 ENCOUNTER — Emergency Department (HOSPITAL_BASED_OUTPATIENT_CLINIC_OR_DEPARTMENT_OTHER)
Admission: EM | Admit: 2010-08-05 | Discharge: 2010-08-05 | Disposition: A | Discharge status: Home / Self Care | Payer: BC Managed Care – PPO | Attending: Emergency Medicine | Admitting: Emergency Medicine

## 2010-08-05 ENCOUNTER — Encounter (HOSPITAL_BASED_OUTPATIENT_CLINIC_OR_DEPARTMENT_OTHER): Payer: Self-pay | Admitting: *Deleted

## 2010-08-05 DIAGNOSIS — G43909 Migraine, unspecified, not intractable, without status migrainosus: Secondary | ICD-10-CM | POA: Insufficient documentation

## 2010-08-05 DIAGNOSIS — F319 Bipolar disorder, unspecified: Secondary | ICD-10-CM | POA: Insufficient documentation

## 2010-08-05 MED ORDER — ONDANSETRON HCL 4 MG/2ML IJ SOLN
INTRAMUSCULAR | Status: AC
  Filled 2010-08-05: qty 2

## 2010-08-05 MED ORDER — SODIUM CHLORIDE 0.9 % IV SOLN
INTRAVENOUS | Status: DC
  Administered 2010-08-05: 1000 mL via INTRAVENOUS

## 2010-08-05 MED ORDER — PROMETHAZINE HCL 25 MG/ML IJ SOLN
25.0000 mg | Freq: Once | INTRAMUSCULAR | Status: AC
  Administered 2010-08-05: 25 mg via INTRAVENOUS
  Filled 2010-08-05: qty 1

## 2010-08-05 MED ORDER — HYDROMORPHONE HCL 1 MG/ML IJ SOLN
1.0000 mg | Freq: Once | INTRAMUSCULAR | Status: AC
  Administered 2010-08-05: 1 mg via INTRAVENOUS
  Filled 2010-08-05 (×2): qty 1

## 2010-08-05 NOTE — ED Notes (Signed)
Migraine headache. Light and sound sensitive. Took Imetrex yesterday with temporary relief.

## 2010-08-05 NOTE — ED Provider Notes (Signed)
History     CSN: [id number] Arrival date & time: 08/05/2010 11:10 AM  Chief Complaint  Patient presents with  . Migraine   HPI Comments: Patient states she has history of migraines. This is pretty similar to her prior migraine headaches. She does not have to regularly take medications such as Imitrex when she did this last time it only helped for a few hours and then the headache returned. She denies any neck pain or stiffness there's been no fevers no focal numbness or weakness.  Patient is a 45 y.o. female presenting with migraine. The history is provided by the patient.  Migraine This is a recurrent problem. The problem occurs constantly. The problem has not changed since onset.Associated symptoms include headaches. Pertinent negatives include no abdominal pain and no shortness of breath. Exacerbated by: sound and light. Relieved by: the immitrex helped slightly but the pain returned.    Past Medical History  Diagnosis Date  . Depression   . Bipolar disorder   . Migraine     History reviewed. No pertinent past surgical history.  Family History  Problem Relation Age of Onset  . Stroke Neg Hx   . Mental retardation Neg Hx   . Diabetes Neg Hx   . Cancer Neg Hx   . Heart disease Neg Hx     History  Substance Use Topics  . Smoking status: Never Smoker   . Smokeless tobacco: Not on file  . Alcohol Use: No    OB History    Grav Para Term Preterm Abortions TAB SAB Ect Mult Living                  Review of Systems  Respiratory: Negative for shortness of breath.   Gastrointestinal: Negative for abdominal pain.  Neurological: Positive for headaches. Negative for seizures, weakness and numbness.  All other systems reviewed and are negative.    Physical Exam  BP 114/74  Pulse 89  Temp(Src) 98.5 F (36.9 C) (Oral)  Resp 18  SpO2 100%  Physical Exam  Constitutional: She appears well-developed and well-nourished. No distress.  HENT:  Head: Normocephalic and  atraumatic.  Right Ear: External ear normal.  Left Ear: External ear normal.  Eyes: Conjunctivae are normal. Right eye exhibits no discharge. Left eye exhibits no discharge. No scleral icterus.  Neck: Neck supple. No tracheal deviation present.  Cardiovascular: Normal rate, regular rhythm and intact distal pulses.   Pulmonary/Chest: Effort normal and breath sounds normal. No stridor. No respiratory distress. She has no wheezes. She has no rales.  Abdominal: Soft. Bowel sounds are normal. She exhibits no distension. There is no tenderness. There is no rebound and no guarding.  Musculoskeletal: She exhibits no edema and no tenderness.  Neurological: She is alert. She has normal strength. No sensory deficit. Cranial nerve deficit:  no gross defecits noted. She exhibits normal muscle tone. She displays no seizure activity. Coordination normal.  Skin: Skin is warm and dry. No rash noted.  Psychiatric: She has a normal mood and affect.    ED Course  Procedures The patient was given IV fluids and IV medications consisting of Dilaudid Phenergan which she is tolerating the past. .  MDM 1:13 PM patient is feeling better at this point. I feel his symptoms are consistent with a migraine headache. No symptoms to suggest stroke, subarachnoid hemorrhage, meningitis. At this point there is no evidence of any acute emergency medical condition. We'll discharge patient home at this point  Celene Kras, MD 08/05/10 [phone]

## 2010-09-02 ENCOUNTER — Emergency Department (HOSPITAL_COMMUNITY)
Admission: EM | Admit: 2010-09-02 | Discharge: 2010-09-02 | Disposition: A | Discharge status: Home / Self Care | Payer: BC Managed Care – PPO | Attending: Emergency Medicine | Admitting: Emergency Medicine

## 2010-09-02 DIAGNOSIS — R110 Nausea: Secondary | ICD-10-CM | POA: Insufficient documentation

## 2010-09-02 DIAGNOSIS — R51 Headache: Secondary | ICD-10-CM | POA: Insufficient documentation

## 2011-11-29 ENCOUNTER — Other Ambulatory Visit: Payer: Self-pay | Admitting: Internal Medicine

## 2011-11-29 DIAGNOSIS — R1011 Right upper quadrant pain: Secondary | ICD-10-CM

## 2011-11-29 DIAGNOSIS — R110 Nausea: Secondary | ICD-10-CM

## 2011-11-30 ENCOUNTER — Ambulatory Visit
Admission: RE | Admit: 2011-11-30 | Discharge: 2011-11-30 | Disposition: A | Discharge status: Home / Self Care | Payer: 59 | Source: Ambulatory Visit | Attending: Internal Medicine | Admitting: Internal Medicine

## 2011-11-30 ENCOUNTER — Other Ambulatory Visit (HOSPITAL_COMMUNITY): Payer: Self-pay | Admitting: Internal Medicine

## 2011-11-30 DIAGNOSIS — R1011 Right upper quadrant pain: Secondary | ICD-10-CM

## 2011-11-30 DIAGNOSIS — R110 Nausea: Secondary | ICD-10-CM

## 2011-12-23 ENCOUNTER — Ambulatory Visit (HOSPITAL_COMMUNITY)
Admission: RE | Admit: 2011-12-23 | Discharge: 2011-12-23 | Disposition: A | Discharge status: Home / Self Care | Payer: 59 | Source: Ambulatory Visit | Attending: Internal Medicine | Admitting: Internal Medicine

## 2011-12-23 ENCOUNTER — Encounter (HOSPITAL_COMMUNITY): Admission: RE | Admit: 2011-12-23 | Payer: 59 | Source: Ambulatory Visit

## 2011-12-23 DIAGNOSIS — R1011 Right upper quadrant pain: Secondary | ICD-10-CM

## 2011-12-23 DIAGNOSIS — R110 Nausea: Secondary | ICD-10-CM

## 2011-12-23 MED ORDER — TECHNETIUM TC 99M MEBROFENIN IV KIT
5.5000 | PACK | Freq: Once | INTRAVENOUS | Status: AC | PRN
  Administered 2011-12-23: 6 via INTRAVENOUS

## 2012-06-20 ENCOUNTER — Emergency Department (HOSPITAL_BASED_OUTPATIENT_CLINIC_OR_DEPARTMENT_OTHER)
Admission: EM | Admit: 2012-06-20 | Discharge: 2012-06-20 | Disposition: A | Discharge status: Home / Self Care | Payer: 59 | Attending: Emergency Medicine | Admitting: Emergency Medicine

## 2012-06-20 ENCOUNTER — Encounter (HOSPITAL_BASED_OUTPATIENT_CLINIC_OR_DEPARTMENT_OTHER): Payer: Self-pay

## 2012-06-20 DIAGNOSIS — Z79899 Other long term (current) drug therapy: Secondary | ICD-10-CM | POA: Insufficient documentation

## 2012-06-20 DIAGNOSIS — F319 Bipolar disorder, unspecified: Secondary | ICD-10-CM | POA: Insufficient documentation

## 2012-06-20 DIAGNOSIS — R110 Nausea: Secondary | ICD-10-CM | POA: Insufficient documentation

## 2012-06-20 DIAGNOSIS — G43909 Migraine, unspecified, not intractable, without status migrainosus: Secondary | ICD-10-CM | POA: Insufficient documentation

## 2012-06-20 MED ORDER — HYDROMORPHONE HCL PF 1 MG/ML IJ SOLN
0.5000 mg | Freq: Once | INTRAMUSCULAR | Status: AC
  Administered 2012-06-20: 0.5 mg via INTRAVENOUS
  Filled 2012-06-20: qty 1

## 2012-06-20 MED ORDER — PROMETHAZINE HCL 25 MG/ML IJ SOLN
25.0000 mg | Freq: Once | INTRAMUSCULAR | Status: AC
  Administered 2012-06-20: 25 mg via INTRAVENOUS
  Filled 2012-06-20: qty 1

## 2012-06-20 NOTE — ED Notes (Signed)
Pt states that she has had migraine headache for several days, was seen by Dr Loree Fee, at [facility].  Was told to come to this ER so that she would have better treatment options for her migraine headache.  Pt states that she is not sure if she has been running a fever, c/o nausea, denies vomiting, c/o insomnia, anorexia.

## 2012-06-20 NOTE — ED Provider Notes (Signed)
History     CSN: [id number]  Arrival date & time 06/20/12  1157   First MD Initiated Contact with Patient 06/20/12 1232      Chief Complaint  Patient presents with  . Migraine    (Consider location/radiation/quality/duration/timing/severity/associated sxs/prior treatment) HPI Comments: Pt states that she was sent over here from her pcp because she has had a migraine for 3 days and she is getting no relief from her imitrex at home:denies vomiting:similar to previous headaches just not relieved with meds  Patient is a 47 y.o. female presenting with migraines. The history is provided by the patient. No language interpreter was used.  Migraine This is a new problem. The current episode started today. The problem occurs constantly. The problem has been unchanged. Associated symptoms include nausea. Pertinent negatives include no fever or vomiting. Nothing aggravates the symptoms. She has tried nothing for the symptoms.    Past Medical History  Diagnosis Date  . Depression   . Bipolar disorder   . Migraine     History reviewed. No pertinent past surgical history.  Family History  Problem Relation Age of Onset  . Stroke Neg Hx   . Mental retardation Neg Hx   . Diabetes Neg Hx   . Cancer Neg Hx   . Heart disease Neg Hx     History  Substance Use Topics  . Smoking status: Never Smoker   . Smokeless tobacco: Not on file  . Alcohol Use: No    OB History   Grav Para Term Preterm Abortions TAB SAB Ect Mult Living                  Review of Systems  Constitutional: Negative for fever.  Respiratory: Negative.   Cardiovascular: Negative.   Gastrointestinal: Positive for nausea. Negative for vomiting.    Allergies  Codeine; Droperidol; and Ketorolac tromethamine  Home Medications   Current Outpatient Rx  Name  Route  Sig  Dispense  Refill  . norgestimate-ethinyl estradiol (ORTHO-CYCLEN,SPRINTEC,PREVIFEM) 0.25-35 MG-MCG tablet   Oral   Take 1 tablet by mouth  daily.         . propranolol (INDERAL) 80 MG tablet   Oral   Take 80 mg by mouth 3 (three) times daily.         . SUMAtriptan Succinate (IMITREX PO)   Oral   Take 100 mg by mouth as needed.          . temazepam (RESTORIL) 15 MG capsule   Oral   Take 15 mg by mouth at bedtime as needed for sleep.         . Topiramate (TOPAMAX PO)   Oral   Take by mouth daily.         Marland Kitchen lamoTRIgine (LAMICTAL) 25 MG tablet   Oral   Take 25 mg by mouth daily. Take 2 tablets daily          . LORAZEPAM PO   Oral   Take by mouth.          . OLANZapine (ZYPREXA) 5 MG tablet   Oral   Take 5 mg by mouth at bedtime.           . Prenatal Vitamins (CALNA PO)   Oral   Take 1 tablet by mouth daily.           Marland Kitchen venlafaxine (EFFEXOR) 100 MG tablet   Oral   Take 100 mg by mouth daily. Take 3 tablets in the evening  BP 97/55  Pulse 71  Temp(Src) 98.4 F (36.9 C) (Oral)  Resp 20  Ht 5\' 1"  (1.549 m)  Wt 104 lb (47.174 kg)  BMI 19.66 kg/m2  SpO2 100%  Physical Exam  Nursing note and vitals reviewed. Constitutional: She is oriented to person, place, and time. She appears well-developed and well-nourished.  HENT:  Head: Normocephalic and atraumatic.  Eyes: Conjunctivae and EOM are normal. Pupils are equal, round, and reactive to light.  Neck: Normal range of motion. Neck supple.  Cardiovascular: Normal rate and regular rhythm.   Pulmonary/Chest: Effort normal and breath sounds normal.  Musculoskeletal: Normal range of motion.  Neurological: She is alert and oriented to person, place, and time. Coordination normal.  Skin: Skin is warm and dry.  Psychiatric: She has a normal mood and affect.    ED Course  Procedures (including critical care time)  Labs Reviewed - No data to display No results found.   1. Migraine       MDM  Pt is feeling better at this time:will send home        Teressa Lower, NP 06/20/12 1349

## 2012-06-20 NOTE — ED Provider Notes (Signed)
Medical screening examination/treatment/procedure(s) were performed by non-physician practitioner and as supervising physician I was immediately available for consultation/collaboration.  Larone Kliethermes, MD 06/20/12 2056 

## 2012-08-22 ENCOUNTER — Institutional Professional Consult (permissible substitution): Payer: 59 | Admitting: Pulmonary Disease

## 2012-09-11 ENCOUNTER — Institutional Professional Consult (permissible substitution): Payer: 59 | Admitting: Pulmonary Disease

## 2012-10-05 ENCOUNTER — Institutional Professional Consult (permissible substitution): Payer: 59 | Admitting: Pulmonary Disease

## 2012-10-08 ENCOUNTER — Encounter: Payer: Self-pay | Admitting: Family Medicine

## 2012-10-08 ENCOUNTER — Ambulatory Visit (INDEPENDENT_AMBULATORY_CARE_PROVIDER_SITE_OTHER): Payer: 59 | Admitting: Family Medicine

## 2012-10-08 VITALS — BP 116/80 | HR 73 | Temp 98.4°F | Resp 16 | Ht 60.5 in | Wt 101.0 lb

## 2012-10-08 DIAGNOSIS — F39 Unspecified mood [affective] disorder: Secondary | ICD-10-CM

## 2012-10-08 DIAGNOSIS — M79609 Pain in unspecified limb: Secondary | ICD-10-CM

## 2012-10-08 DIAGNOSIS — Z23 Encounter for immunization: Secondary | ICD-10-CM

## 2012-10-08 DIAGNOSIS — R634 Abnormal weight loss: Secondary | ICD-10-CM

## 2012-10-08 DIAGNOSIS — R55 Syncope and collapse: Secondary | ICD-10-CM

## 2012-10-08 DIAGNOSIS — Z79899 Other long term (current) drug therapy: Secondary | ICD-10-CM

## 2012-10-08 DIAGNOSIS — G8929 Other chronic pain: Secondary | ICD-10-CM

## 2012-10-08 LAB — CBC WITH DIFFERENTIAL/PLATELET
Basophils Absolute: 0 10*3/uL (ref 0.0–0.1)
Eosinophils Absolute: 0.1 10*3/uL (ref 0.0–0.7)
HCT: 40.6 % (ref 36.0–46.0)
Lymphs Abs: 1.8 10*3/uL (ref 0.7–4.0)
MCHC: 32.8 g/dL (ref 30.0–36.0)
MCV: 91.5 fl (ref 78.0–100.0)
Monocytes Absolute: 0.3 10*3/uL (ref 0.1–1.0)
Monocytes Relative: 6.3 % (ref 3.0–12.0)
Neutro Abs: 3.1 10*3/uL (ref 1.4–7.7)
Platelets: 223 10*3/uL (ref 150.0–400.0)
RDW: 13.1 % (ref 11.5–14.6)
WBC: 5.3 10*3/uL (ref 4.5–10.5)

## 2012-10-08 LAB — COMPREHENSIVE METABOLIC PANEL
ALT: 37 U/L — ABNORMAL HIGH (ref 0–35)
AST: 26 U/L (ref 0–37)
Alkaline Phosphatase: 94 U/L (ref 39–117)
Creatinine, Ser: 0.7 mg/dL (ref 0.4–1.2)
GFR: 104.74 mL/min (ref >60.00)
Potassium: 4.6 mEq/L (ref 3.5–5.1)
Total Bilirubin: 0.5 mg/dL (ref 0.3–1.2)

## 2012-10-08 LAB — TSH: TSH: 0.95 u[IU]/mL (ref 0.35–5.50)

## 2012-10-08 NOTE — Progress Notes (Signed)
Office Note 10/10/2012  CC:  Chief Complaint  Patient presents with  . Establish Care    HPI:  Alyssa [name] is a 47 y.o. White female who is here to establish care. Patient's most recent primary MD: [facility] (last saw a PA/NP there about 3 mo ago, last labs 9-12 mo ago per her estimate). Old records were not reviewed prior to or during today's visit.  Main concerns: wants thyroid level checked and wants "anemia" checked.  She reports hx of low iron in the past from nutritional deficiency (denies abnl menstrual bleeding, denies hx of GI bleeding), was on [place] in the past. Says she wants these checked b/c she has been having presyncopal feeling followed by brief syncope 3-4 times per week, never hurts herself, says the episodes are never witnessed so she doesn't know how long she is unconscious.  No palpitations, no HA, no vision problems.  No focal weakness, no swallowing probs, no speech problems.  Denies CP or SOB.  Also, says feet hurt "all over" for the last 3 mo.  Constant "throbbing, constant, sharp", worse with weight bearing or walking but also present to a decent degree with complete rest.  No feeling of loss of sensation, no burning, no loss of strength.  No hx of injury to feet.  She stands up and is walking for approx 8 hours per day on her job at [employer].  ROS: see above in HPI, plus she reports 35 lb wt loss in the last 2 yrs--undesired.  Denies abd pain or appetite loss.  Menses regular-occuring, without excessive bleeding.  No hematochezia or melena.  Pt states she does feel psych stress and says she is "overworked".  Past Medical History  Diagnosis Date  . Depression   . Bipolar disorder   . Migraine   . Anemia remote past    nutritional; slow Fe  . Fainting spell   . Frequent headaches   . Thyroid disease     History reviewed. No pertinent past surgical history.  Family History  Problem Relation Age of Onset  . Mental retardation  Neg Hx   . Diabetes Neg Hx   . Heart disease Neg Hx   . Cancer Sister   . Stroke Maternal Grandmother   . Alcohol abuse Paternal Grandfather     History   Social History  . Marital Status: Married    Spouse Name: N/A    Number of Children: N/A  . Years of Education: N/A   Occupational History  . Not on file.   Social History Main Topics  . Smoking status: Never Smoker   . Smokeless tobacco: Never Used  . Alcohol Use: No  . Drug Use: No  . Sexual Activity: Yes   Other Topics Concern  . Not on file   Social History Narrative   Married, no children.     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   Hobbies/interests: reading, her dog, church, visit with family.   No T/A/Ds.    Outpatient Encounter Prescriptions as of 10/08/2012  Medication Sig Dispense Refill  . norgestimate-ethinyl estradiol (ORTHO-CYCLEN,SPRINTEC,PREVIFEM) 0.25-35 MG-MCG tablet Take 1 tablet by mouth daily.      . temazepam (RESTORIL) 15 MG capsule Take 15 mg by mouth at bedtime as needed for sleep.      Marland Kitchen venlafaxine (EFFEXOR) 100 MG tablet Take 100 mg by mouth daily. Take 3 tablets in the evening       .  CLINDESSE vaginal cream       . doxepin (SINEQUAN) 25 MG capsule       . isometheptene-acetaminophen-dichloralphenazone (MIDRIN) 65-325-100 MG capsule       . lamoTRIgine (LAMICTAL) 25 MG tablet Take 25 mg by mouth daily. Take 2 tablets daily       . LORAZEPAM PO Take by mouth.       . metroNIDAZOLE (FLAGYL) 500 MG tablet       . OLANZapine (ZYPREXA) 5 MG tablet Take 5 mg by mouth at bedtime.        . propranolol (INDERAL) 80 MG tablet Take 80 mg by mouth 3 (three) times daily.      . SUMAtriptan Succinate (IMITREX PO) Take 100 mg by mouth as needed.       . Topiramate (TOPAMAX PO) Take by mouth daily.      Marland Kitchen zonisamide (ZONEGRAN) 25 MG capsule       . [DISCONTINUED] Prenatal Vitamins (CALNA PO) Take 1 tablet by mouth daily.         No facility-administered encounter medications on file as  of 10/08/2012.    Allergies  Allergen Reactions  . Codeine     REACTION: RASH, NASUEA  . Droperidol     extraparamodol  . Ketorolac Tromethamine     extraparamadol    ROS See HPI PE; Blood pressure 116/80, pulse 73, temperature 98.4 F (36.9 C), temperature source Temporal, resp. rate 16, height 5' 0.5" (1.537 m), weight 101 lb (45.813 kg), SpO2 100.00%. Gen: Alert, well appearing.  Patient is oriented to person, place, time, and situation. ENT:  Eyes: no injection, icteris, swelling, or exudate.  EOMI, PERRLA. Nose: no drainage or turbinate edema/swelling.  No injection or focal lesion.  Mouth: lips without lesion/swelling.  Oral mucosa pink and moist.  Dentition intact and without obvious caries or gingival swelling.  Oropharynx without erythema, exudate, or swelling.  Neck - No masses or thyromegaly or limitation in range of motion CV: RRR, no m/r/g.   LUNGS: CTA bilat, nonlabored resps, good aeration in all lung fields. Ankles and feet with normal ROM without stiffness or laxity.  No warmth, erythema, or swelling. She has some random areas of mild tenderness as I palpate, but the areas she winces about or says are tender at one moment are not necessarily tender when I palpate them again a few seconds later.  No area has any SIGNIFICANT tenderness.  Sensation in feet is intact. Musculoskeletal: no joint swelling, erythema, warmth, or tenderness.  ROM of all joints intact. ABD: soft, NT, ND, BS normal.  No hepatospenomegaly or mass.  No bruits. EXT: no clubbing, cyanosis, or edema.  Neuro: CN 2-12 intact bilaterally, strength 5/5 in proximal and distal upper extremities and lower extremities bilaterally.  No tremor.    Pertinent labs:  None today  ASSESSMENT AND PLAN:   New pt: obtain old records.  Syncope Suspect this is fatigue + psychologically-induced. Will start with checking CBC, CMET, and TSH today. EKG likely at next f/u visit.  Chronic foot pain Suspect  musculo-tendonous pain. Will check vit B12 level (forgot to order this today). Will check plain films of both feet. Sports med or podiatry referral in near future if not much improvement with rest and bid alleve (2 tabs each dose with food).  Mood disorder Continue psychiatrist f/u and current meds (psych is managing these).  Abnormal weight loss Need to get a better history regarding this complaint. No red flags for chronic illness.  Possibly med related (psych meds). Will check CBC, CMET, TSH today to start. Obtain old records/old wt's.  An After Visit Summary was printed and given to the patient.  Return in about 1 week (around 10/15/2012) for f/u feet pain.

## 2012-10-09 ENCOUNTER — Ambulatory Visit (HOSPITAL_BASED_OUTPATIENT_CLINIC_OR_DEPARTMENT_OTHER)
Admission: RE | Admit: 2012-10-09 | Discharge: 2012-10-09 | Disposition: A | Discharge status: Home / Self Care | Payer: 59 | Source: Ambulatory Visit | Attending: Family Medicine | Admitting: Family Medicine

## 2012-10-09 DIAGNOSIS — G8929 Other chronic pain: Secondary | ICD-10-CM | POA: Insufficient documentation

## 2012-10-09 DIAGNOSIS — M79609 Pain in unspecified limb: Secondary | ICD-10-CM | POA: Insufficient documentation

## 2012-10-10 DIAGNOSIS — F39 Unspecified mood [affective] disorder: Secondary | ICD-10-CM | POA: Insufficient documentation

## 2012-10-10 DIAGNOSIS — R55 Syncope and collapse: Secondary | ICD-10-CM | POA: Insufficient documentation

## 2012-10-10 DIAGNOSIS — R634 Abnormal weight loss: Secondary | ICD-10-CM | POA: Insufficient documentation

## 2012-10-10 DIAGNOSIS — G8929 Other chronic pain: Secondary | ICD-10-CM | POA: Insufficient documentation

## 2012-10-10 HISTORY — DX: Unspecified mood (affective) disorder: F39

## 2012-10-10 HISTORY — DX: Syncope and collapse: R55

## 2012-10-10 HISTORY — DX: Abnormal weight loss: R63.4

## 2012-10-10 NOTE — Addendum Note (Signed)
Addended by: Eulah Pont on: 10/10/2012 10:31 AM   Modules accepted: Orders

## 2012-10-10 NOTE — Assessment & Plan Note (Signed)
Continue psychiatrist f/u and current meds (psych is managing these).

## 2012-10-10 NOTE — Assessment & Plan Note (Signed)
Suspect musculo-tendonous pain. Will check vit B12 level (forgot to order this today). Will check plain films of both feet. Sports med or podiatry referral in near future if not much improvement with rest and bid alleve (2 tabs each dose with food).

## 2012-10-10 NOTE — Assessment & Plan Note (Signed)
Need to get a better history regarding this complaint. No red flags for chronic illness.  Possibly med related (psych meds). Will check CBC, CMET, TSH today to start. Obtain old records/old wt's.

## 2012-10-10 NOTE — Assessment & Plan Note (Addendum)
Suspect this is fatigue + psychologically-induced. Will start with checking CBC, CMET, and TSH today. EKG likely at next f/u visit.

## 2012-10-15 ENCOUNTER — Ambulatory Visit (INDEPENDENT_AMBULATORY_CARE_PROVIDER_SITE_OTHER): Payer: 59 | Admitting: Family Medicine

## 2012-10-15 ENCOUNTER — Encounter: Payer: Self-pay | Admitting: Family Medicine

## 2012-10-15 VITALS — BP 110/76 | HR 93 | Temp 99.3°F | Resp 16 | Ht 60.5 in | Wt 103.0 lb

## 2012-10-15 DIAGNOSIS — G8929 Other chronic pain: Secondary | ICD-10-CM

## 2012-10-15 DIAGNOSIS — M79609 Pain in unspecified limb: Secondary | ICD-10-CM

## 2012-10-15 MED ORDER — METAXALONE 800 MG PO TABS: ORAL_TABLET | ORAL | Status: DC

## 2012-10-15 NOTE — Progress Notes (Signed)
OFFICE NOTE  10/15/2012  CC:  Chief Complaint  Patient presents with  . Follow-up     HPI: Patient is a 47 y.o. Caucasian female who is here for 1 wk f/u feet pain. Feet largely unchanged.  Asks if she can go back to work, if any restrictions are recommended. Asks for skelaxin b/c she says years ago it helped her with musculoskeletal pain and she is hopeful it will help with her feet/tendonitis. She still has not received the rx anti-inflam cream I rx'd for her last visit-should be getting in mail this week.   Pertinent PMH:  Past Medical History  Diagnosis Date  . Depression   . Bipolar disorder   . Migraine   . Anemia remote past    nutritional; slow Fe  . Fainting spell   . Frequent headaches   . Thyroid disease    Past surgical, social, and family history reviewed and no changes noted since last office visit.  MEDS:  Outpatient Prescriptions Prior to Visit  Medication Sig Dispense Refill  . doxepin (SINEQUAN) 25 MG capsule       . lamoTRIgine (LAMICTAL) 25 MG tablet Take 25 mg by mouth daily. Take 2 tablets daily       . temazepam (RESTORIL) 15 MG capsule Take 15 mg by mouth at bedtime as needed for sleep.      Marland Kitchen venlafaxine (EFFEXOR) 100 MG tablet Take 100 mg by mouth daily. Take 3 tablets in the evening       . CLINDESSE vaginal cream       . isometheptene-acetaminophen-dichloralphenazone (MIDRIN) 65-325-100 MG capsule       . LORAZEPAM PO Take by mouth.       . metroNIDAZOLE (FLAGYL) 500 MG tablet       . norgestimate-ethinyl estradiol (ORTHO-CYCLEN,SPRINTEC,PREVIFEM) 0.25-35 MG-MCG tablet Take 1 tablet by mouth daily.      Marland Kitchen OLANZapine (ZYPREXA) 5 MG tablet Take 5 mg by mouth at bedtime.        . propranolol (INDERAL) 80 MG tablet Take 80 mg by mouth 3 (three) times daily.      . SUMAtriptan Succinate (IMITREX PO) Take 100 mg by mouth as needed.       . Topiramate (TOPAMAX PO) Take by mouth daily.      Marland Kitchen zonisamide (ZONEGRAN) 25 MG capsule        No  facility-administered medications prior to visit.    PE: Blood pressure 110/76, pulse 93, temperature 99.3 F (37.4 C), temperature source Temporal, resp. rate 16, height 5' 0.5" (1.537 m), weight 103 lb (46.72 kg), SpO2 98.00%. Gen: Alert, well appearing.  Patient is oriented to person, place, time, and situation. AFFECT: pleasant, lucid thought and speech.   IMPRESSION AND PLAN:  Feet pain, suspect diffuse overuse tendonitis/musculoskeletal pain. X-rays last week normal. Reviewed CBC, CMET, TSH from last week as well--all normal and she is reassured by these results. We updated her med list today. I did agree to a trial of skelaxin 800mg , 1 tid prn, #10, RF x 1 to see if somehow this helps with her feet. I wrote a note allowing her to return to work tomorrow 10/16/12 without restrictions and we'll see how things go.  FOLLOW UP: prn

## 2012-10-18 ENCOUNTER — Institutional Professional Consult (permissible substitution): Payer: 59 | Admitting: Pulmonary Disease

## 2012-12-17 ENCOUNTER — Telehealth: Payer: Self-pay | Admitting: Family Medicine

## 2012-12-17 DIAGNOSIS — G8929 Other chronic pain: Secondary | ICD-10-CM

## 2012-12-17 NOTE — Telephone Encounter (Signed)
Please advise 

## 2012-12-17 NOTE — Telephone Encounter (Signed)
Her sister had surgery at [facility]. She would like to go to the same place. Dr. Magnus Ivan or Dr. August Saucer or Dr. Lajoyce Corners or Dr. Tresa Res or Dr. Ardis Rowan or Dr Ophelia Charter are in-network for her.

## 2012-12-17 NOTE — Telephone Encounter (Signed)
Patients foot pain has not gotten any better, she says it has actually gotten significantly worse and she would like a referral to an orthopedic.

## 2012-12-17 NOTE — Telephone Encounter (Signed)
Ortho referral ordered.

## 2013-01-02 ENCOUNTER — Encounter: Payer: 59 | Admitting: Family Medicine

## 2013-01-03 NOTE — L&D Delivery Note (Signed)
Operative Delivery Note At 1:21 PM a viable and healthy female was delivered via Vaginal, Vacuum Investment banker, operational(Extractor).  Presentation: vertex; Position: Right,, Occiput,, Anterior; Station: +4.  Verbal consent: obtained from patient.  Risks and benefits discussed in detail.  Risks include, but are not limited to the risks of anesthesia, bleeding, infection, damage to maternal tissues, fetal cephalhematoma.  There is also the risk of inability to effect vaginal delivery of the head, or shoulder dystocia that cannot be resolved by established maneuvers, leading to the need for emergency cesarean section.   Due to ineffective maternal expulsive efforts after 2 hours of pushing the decision to proceed with vacuum-assisted vaginal delivery. The above consent was obtained. The vacuum was applied and with the 3rd contraction popped-off. A second degree episiotomy was cut. The vacuum was re-applied and the infants head was delivered easily to crowning and then disengaged. The body then delivered and the infant was passed to the maternal abdomen. After the cord was clamped and cut and the infant was passed to the isolet. The placenta delivered spontaneously, intact, with 3V cord. A 4th degree laceration was noted.  The patient had uterine atony after delivery of the placenta and responded to bimanual massage, IV pitocin, IM methergine.  The 4th degree laceration was repaired with 4-0 vicryl in a running fashion. The 3rd degree was repaired with 0 vicryl figure-of-8 sutures. The 2nd degree was repaired in the usual fashion. Misoprostal 1000mcg was placed rectally after the repair. The rectal mucosa was intact after the repair  APGAR: 8, 9; weight 7 lb 8.5 oz (3416 g).   Placenta status: Intact, Spontaneous.   Cord: 3 vessels   Anesthesia: Epidural  Instruments: Kiwi vacuum Episiotomy: Median, 2nd degree Lacerations: 4th degree extension Suture Repair: 3.0 vicryl 0 vicryl, 4-0 vicryl Est. Blood Loss (mL): 500  Mom to  postpartum.  Baby to Couplet care / Skin to Skin.  Alyssa [name] H. 08/11/2013, 2:17 PM

## 2013-01-04 ENCOUNTER — Ambulatory Visit (INDEPENDENT_AMBULATORY_CARE_PROVIDER_SITE_OTHER): Payer: 59 | Admitting: Family Medicine

## 2013-01-04 ENCOUNTER — Encounter: Payer: Self-pay | Admitting: Family Medicine

## 2013-01-04 ENCOUNTER — Other Ambulatory Visit: Payer: 59

## 2013-01-04 ENCOUNTER — Ambulatory Visit (INDEPENDENT_AMBULATORY_CARE_PROVIDER_SITE_OTHER)
Admission: RE | Admit: 2013-01-04 | Discharge: 2013-01-04 | Disposition: A | Discharge status: Home / Self Care | Payer: 59 | Source: Ambulatory Visit | Attending: Family Medicine | Admitting: Family Medicine

## 2013-01-04 VITALS — BP 116/74 | HR 80 | Temp 98.6°F | Resp 16 | Ht 60.5 in | Wt 102.0 lb

## 2013-01-04 DIAGNOSIS — M7918 Myalgia, other site: Secondary | ICD-10-CM

## 2013-01-04 DIAGNOSIS — IMO0001 Reserved for inherently not codable concepts without codable children: Secondary | ICD-10-CM

## 2013-01-04 DIAGNOSIS — M25541 Pain in joints of right hand: Secondary | ICD-10-CM

## 2013-01-04 DIAGNOSIS — M25549 Pain in joints of unspecified hand: Secondary | ICD-10-CM

## 2013-01-04 LAB — COMPREHENSIVE METABOLIC PANEL
ALK PHOS: 84 U/L (ref 39–117)
ALT: 16 U/L (ref 0–35)
AST: 14 U/L (ref 0–37)
Albumin: 4.2 g/dL (ref 3.5–5.2)
BUN: 12 mg/dL (ref 6–23)
CHLORIDE: 100 meq/L (ref 96–112)
CO2: 28 mEq/L (ref 19–32)
CREATININE: 0.53 mg/dL (ref 0.50–1.10)
Calcium: 9.3 mg/dL (ref 8.4–10.5)
Glucose, Bld: 89 mg/dL (ref 70–99)
Potassium: 4 mEq/L (ref 3.5–5.3)
Sodium: 140 mEq/L (ref 135–145)
Total Bilirubin: 0.6 mg/dL (ref 0.3–1.2)
Total Protein: 6.2 g/dL (ref 6.0–8.3)

## 2013-01-04 LAB — CK: CK TOTAL: 39 U/L (ref 7–177)

## 2013-01-04 LAB — RHEUMATOID FACTOR: Rhuematoid fact SerPl-aCnc: <10 IU/mL (ref <=14)

## 2013-01-04 LAB — C-REACTIVE PROTEIN: CRP: <0.5 mg/dL (ref <0.60)

## 2013-01-04 LAB — TSH: TSH: 2.12 u[IU]/mL (ref 0.350–4.500)

## 2013-01-04 NOTE — Progress Notes (Signed)
Pre visit review using our clinic review tool, if applicable. No additional management support is needed unless otherwise documented below in the visit note. 

## 2013-01-04 NOTE — Progress Notes (Signed)
Office Note 01/17/2013  CC:  Chief Complaint  Patient presents with  . Annual Exam  . Pain    wide spread pain.    HPI:  Alyssa [name] is a 48 y.o. White female who is here for CPE. Describes "widespread pain".  She literally says there no part of her body that does not hurt. Onset around 6 wks ago abruptly and has been "full blown" since that time. No swelling of any joints.  All muscles hurt.   Gets recurrent ulcers on back of tongue.  She has eczema but denies any other rashes.   Unclear if night sweats or not.  Appetite comes and goes, drinks a lot of water. Describes fatigue.  She doesn't snore that she knows of.   Insomnia secondary to pain, can't get settled into her bed.  Takes alleve on and off for pain, rare dose of tylenol.    No similar episodes of the pain syndrome in the past.  Mood has been "fine" lately.  No racing thoughts or depression. Currently unemployed.   Past Medical History  Diagnosis Date  . Depression   . Bipolar disorder   . Migraine   . Anemia remote past    nutritional; slow Fe  . Fainting spell   . Frequent headaches   . Hypothyroidism     No past surgical history on file.  Family History  Problem Relation Age of Onset  . Mental retardation Neg Hx   . Diabetes Neg Hx   . Heart disease Neg Hx   . Cancer Sister   . Stroke Maternal Grandmother   . Alcohol abuse Paternal Grandfather     History   Social History  . Marital Status: Married    Spouse Name: N/A    Number of Children: N/A  . Years of Education: N/A   Occupational History  . Not on file.   Social History Main Topics  . Smoking status: Never Smoker   . Smokeless tobacco: Never Used  . Alcohol Use: No  . Drug Use: No  . Sexual Activity: Yes   Other Topics Concern  . Not on file   Social History Narrative   Married, no children.     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   Hobbies/interests: reading, her dog, church, visit with family.   No T/A/Ds.    Outpatient Prescriptions Prior to Visit  Medication Sig Dispense Refill  . doxepin (SINEQUAN) 25 MG capsule       . lamoTRIgine (LAMICTAL) 25 MG tablet Take 25 mg by mouth daily. Take 2 tablets daily       . levothyroxine (SYNTHROID, LEVOTHROID) 50 MCG tablet Take 50 mcg by mouth daily before breakfast.      . temazepam (RESTORIL) 15 MG capsule Take 15 mg by mouth at bedtime as needed for sleep.      Marland Kitchen venlafaxine (EFFEXOR) 100 MG tablet Take 100 mg by mouth daily. Take 3 tablets in the evening       . aspirin 81 MG tablet Take 81 mg by mouth daily. Pt takes 5 tabs at once when she feels a migraine starting      . CLINDESSE vaginal cream       . metaxalone (SKELAXIN) 800 MG tablet 1 tab po tid prn  10 tablet  1  . norgestimate-ethinyl estradiol (ORTHO-CYCLEN,SPRINTEC,PREVIFEM) 0.25-35 MG-MCG tablet Take 1 tablet by mouth daily.      Marland Kitchen OLANZapine (ZYPREXA) 5 MG tablet Take 5  mg by mouth at bedtime.        . propranolol (INDERAL) 80 MG tablet Take 80 mg by mouth 3 (three) times daily.      Marland Kitchen. zonisamide (ZONEGRAN) 25 MG capsule Take 50 mg by mouth at bedtime.        No facility-administered medications prior to visit.    Allergies  Allergen Reactions  . Codeine     REACTION: RASH, NASUEA  . Droperidol     extraparamodol  . Ketorolac Tromethamine     extraparamadol    ROS Review of Systems  Constitutional: Positive for fatigue. Negative for fever.  HENT: Negative for congestion and sore throat.   Eyes: Negative for visual disturbance.  Respiratory: Negative for cough.   Cardiovascular: Negative for chest pain.  Gastrointestinal: Negative for nausea and abdominal pain.  Genitourinary: Negative for dysuria.  Musculoskeletal: Negative for back pain and joint swelling.  Skin: Negative for rash.  Neurological: Negative for weakness and headaches.  Hematological: Negative for adenopathy.     PE; Blood pressure 116/74, pulse 80, temperature 98.6 F (37 C),  temperature source Temporal, resp. rate 16, height 5' 0.5" (1.537 m), weight 102 lb (46.267 kg), SpO2 100.00%. Gen: Alert, well appearing.  Patient is oriented to person, place, time, and situation. AFFECT: pleasant, lucid thought and speech. ENT: Ears: EACs clear, normal epithelium.  TMs with good light reflex and landmarks bilaterally.  Eyes: no injection, icteris, swelling, or exudate.  EOMI, PERRLA. Nose: no drainage or turbinate edema/swelling.  No injection or focal lesion.  Mouth: lips without lesion/swelling.  Oral mucosa pink and moist.  Dentition intact and without obvious caries or gingival swelling.  Oropharynx without erythema, exudate, or swelling.  Neck: supple/nontender.  No LAD, mass, or TM.  Carotid pulses 2+ bilaterally, without bruits. CV: RRR, no m/r/g.   LUNGS: CTA bilat, nonlabored resps, good aeration in all lung fields. ABD: soft, NT, ND, BS normal.  No hepatospenomegaly or mass.  No bruits. EXT: no clubbing, cyanosis, or edema.  Musculoskeletal: no joint swelling, erythema, or warmth.  She has tenderness in all muscle regions of her body. ROM of all joints intact. Skin - no sores or suspicious lesions or rashes or color changes  Pertinent labs:  None today  ASSESSMENT AND PLAN:   Amplified musculoskeletal pain syndrome Unclear etiology. Recheck TSH, CRP, ANA, Rh factor, CPK.  Will check right hand x-ray to eval for erosion in joints. F/u 10d. If w/u unfruitful will strongly consider rheum referral.  An After Visit Summary was printed and given to the patient.  FOLLOW UP:  Return in about 10 days (around 01/14/2013) for f/u pain syndrome.

## 2013-01-07 LAB — ANA: Anti Nuclear Antibody(ANA): NEGATIVE

## 2013-01-17 DIAGNOSIS — M7918 Myalgia, other site: Secondary | ICD-10-CM

## 2013-01-17 DIAGNOSIS — G894 Chronic pain syndrome: Secondary | ICD-10-CM | POA: Insufficient documentation

## 2013-01-17 HISTORY — DX: Myalgia, other site: M79.18

## 2013-01-17 HISTORY — DX: Chronic pain syndrome: G89.4

## 2013-01-17 NOTE — Assessment & Plan Note (Signed)
Unclear etiology. Recheck TSH, CRP, ANA, Rh factor, CPK.  Will check right hand x-ray to eval for erosion in joints. F/u 10d. If w/u unfruitful will strongly consider rheum referral.

## 2013-02-03 ENCOUNTER — Other Ambulatory Visit: Payer: Self-pay | Admitting: Internal Medicine

## 2013-04-12 LAB — OB RESULTS CONSOLE ABO/RH: RH TYPE: POSITIVE

## 2013-04-12 LAB — OB RESULTS CONSOLE RUBELLA ANTIBODY, IGM: RUBELLA: NON-IMMUNE/NOT IMMUNE

## 2013-04-12 LAB — OB RESULTS CONSOLE ANTIBODY SCREEN: Antibody Screen: NEGATIVE

## 2013-04-12 LAB — OB RESULTS CONSOLE RPR: RPR: NONREACTIVE

## 2013-04-12 LAB — OB RESULTS CONSOLE GC/CHLAMYDIA
Chlamydia: NEGATIVE
Gonorrhea: NEGATIVE

## 2013-04-12 LAB — OB RESULTS CONSOLE HEPATITIS B SURFACE ANTIGEN: HEP B S AG: NEGATIVE

## 2013-04-12 LAB — OB RESULTS CONSOLE HIV ANTIBODY (ROUTINE TESTING): HIV: NONREACTIVE

## 2013-06-05 ENCOUNTER — Encounter: Payer: 59 | Attending: Obstetrics and Gynecology

## 2013-06-05 NOTE — Progress Notes (Signed)
  Patient was seen on 06/05/13 for Gestational Diabetes self-management class at the [facility]. The following learning objectives were met by the patient during this course:   States the definition of Gestational Diabetes  States why dietary management is important in controlling blood glucose  Describes the effects of carbohydrates on blood glucose levels  Demonstrates ability to create a balanced meal plan  Demonstrates carbohydrate counting   States when to check blood glucose levels  Demonstrates proper blood glucose monitoring techniques  States the effect of stress and exercise on blood glucose levels  States the importance of limiting caffeine and abstaining from alcohol and smoking  Plan:  Aim for 2 Carb Choices per meal (30 grams) +/- 1 either way for breakfast Aim for 3 Carb Choices per meal (45 grams) +/- 1 either way from lunch and dinner Aim for 1-2 Carbs per snack Begin reading food labels for Total Carbohydrate and sugar grams of foods Consider  increasing your activity level by walking daily as tolerated Begin checking BG before breakfast and 1-2 hours after first bit of breakfast, lunch and dinner after  as directed by MD  Take medication  as directed by MD  Blood glucose monitor given:  One Touch Ultra Mini Self Monitoring Kit Lot # [id number] X Exp: 05/2014 Blood glucose reading: $RemoveBeforeDE'76mg'[id number]$ /dl  Patient instructed to monitor glucose levels: FBS: 60 - <90 1 hour: <140 2 hour: <120  Patient received the following handouts:  Nutrition Diabetes and Pregnancy  Carbohydrate Counting List  Meal Planning worksheet  Patient will be seen for follow-up as needed.

## 2013-06-18 ENCOUNTER — Encounter (HOSPITAL_COMMUNITY): Payer: Self-pay | Admitting: *Deleted

## 2013-06-18 ENCOUNTER — Inpatient Hospital Stay (HOSPITAL_COMMUNITY)
Admission: AD | Admit: 2013-06-18 | Discharge: 2013-06-18 | Disposition: A | Discharge status: Home / Self Care | Payer: 59 | Source: Ambulatory Visit | Attending: Obstetrics and Gynecology | Admitting: Obstetrics and Gynecology

## 2013-06-18 DIAGNOSIS — O09519 Supervision of elderly primigravida, unspecified trimester: Secondary | ICD-10-CM | POA: Insufficient documentation

## 2013-06-18 DIAGNOSIS — O212 Late vomiting of pregnancy: Secondary | ICD-10-CM | POA: Insufficient documentation

## 2013-06-18 DIAGNOSIS — O99810 Abnormal glucose complicating pregnancy: Secondary | ICD-10-CM | POA: Insufficient documentation

## 2013-06-18 DIAGNOSIS — R42 Dizziness and giddiness: Secondary | ICD-10-CM

## 2013-06-18 LAB — URINALYSIS, ROUTINE W REFLEX MICROSCOPIC
Bilirubin Urine: NEGATIVE
GLUCOSE, UA: NEGATIVE mg/dL
Hgb urine dipstick: NEGATIVE
Ketones, ur: 40 mg/dL — AB
LEUKOCYTES UA: NEGATIVE
Nitrite: NEGATIVE
PH: 5.5 (ref 5.0–8.0)
Protein, ur: 30 mg/dL — AB
Specific Gravity, Urine: 1.025 (ref 1.005–1.030)
Urobilinogen, UA: 0.2 mg/dL (ref 0.0–1.0)

## 2013-06-18 LAB — GLUCOSE, CAPILLARY: Glucose-Capillary: 65 mg/dL — ABNORMAL LOW (ref 70–99)

## 2013-06-18 LAB — URINE MICROSCOPIC-ADD ON

## 2013-06-18 NOTE — MAU Provider Note (Signed)
History     CSN: [id number]  Arrival date and time: 06/18/13 1022   First Alyssa [name] Initiated Contact with Patient 06/18/13 1118      Chief Complaint  Patient presents with  . Emesis During Pregnancy  . Diarrhea   Diarrhea  Associated symptoms include headaches ( history of migraines). Pertinent negatives include no abdominal pain, chills, fever or vomiting.    Pt is a 48 yo G1P0 at 6227w2d wks IUP here with report of vomiting since Monday morning, unable to hold liquids down, diarrhea started Sunday night. Vomited 6 times yesterday with 3 loose stools.  No vomiting or diarrhea today.  At chiropractor this AM and felt like "passing out".  Pt is also a gestational diabetic.  FBS this AM was 89.  Denies uc's, bleeding or LOF.   Past Medical History  Diagnosis Date  . Depression   . Bipolar disorder   . Migraine   . Anemia remote past    nutritional; slow Fe  . Fainting spell   . Frequent headaches   . Hypothyroidism   . Gestational diabetes mellitus, antepartum     Past Surgical History  Procedure Laterality Date  . None    . No past surgeries      Family History  Problem Relation Age of Onset  . Mental retardation Neg Hx   . Diabetes Neg Hx   . Heart disease Neg Hx   . Cancer Sister   . Stroke Maternal Grandmother   . Alcohol abuse Paternal Grandfather     History  Substance Use Topics  . Smoking status: Never Smoker   . Smokeless tobacco: Never Used  . Alcohol Use: No    Allergies:  Allergies  Allergen Reactions  . Droperidol     extraparamodol  . Ketorolac Tromethamine     extraparamadol  . Codeine Nausea And Vomiting, Rash and Other (See Comments)    Extra premidol reaction    Prescriptions prior to admission  Medication Sig Dispense Refill  . calcium carbonate (TUMS - DOSED IN MG ELEMENTAL CALCIUM) 500 MG chewable tablet Chew 2 tablets by mouth daily as needed for indigestion or heartburn (nausea).      . cetirizine (ZYRTEC) 10 MG tablet Take 10  mg by mouth daily.      . Prenatal Multivit-Min-Fe-FA (PRENATAL VITAMINS PO) Take 1 each by mouth.      . venlafaxine XR (EFFEXOR-XR) 75 MG 24 hr capsule Take 75 mg by mouth daily with breakfast.        Review of Systems  Constitutional: Negative for fever and chills.  Gastrointestinal: Positive for nausea and diarrhea (last loose stool yesterday). Negative for vomiting and abdominal pain.  Neurological: Positive for dizziness, weakness and headaches ( history of migraines).  All other systems reviewed and are negative.  Physical Exam   Blood pressure 114/74, pulse 92, temperature 97.6 F (36.4 C), temperature source Oral, resp. rate 18, SpO2 100.00%.  Physical Exam  Constitutional: She is oriented to person, place, and time. She appears well-developed and well-nourished. No distress.  HENT:  Head: Normocephalic.  Mouth/Throat: Mucous membranes are dry.  Neck: Normal range of motion. Neck supple.  Cardiovascular: Normal rate, regular rhythm and normal heart sounds.   Respiratory: Effort normal and breath sounds normal. No respiratory distress.  GI: Soft. There is no tenderness.  Hyperactive bowel sounds  Musculoskeletal: Normal range of motion. She exhibits no edema.  Neurological: She is alert and oriented to person, place, and time. She has  normal reflexes.  Skin: Skin is warm and dry.   Cervix - closed/thick  FHR 140's, +accels Toco - irregular at discharge (none felt by patient) MAU Course  Procedures    Results for orders placed during the hospital encounter of 06/18/13 (from the past 24 hour(s))  GLUCOSE, CAPILLARY     Status: Abnormal   Collection Time    06/18/13 11:25 AM      Result Value Ref Range   Glucose-Capillary 65 (*) 70 - 99 mg/dL  URINALYSIS, ROUTINE W REFLEX MICROSCOPIC     Status: Abnormal   Collection Time    06/18/13 11:50 AM      Result Value Ref Range   Color, Urine AMBER (*) YELLOW   APPearance HAZY (*) CLEAR   Specific Gravity, Urine 1.025   1.005 - 1.030   pH 5.5  5.0 - 8.0   Glucose, UA NEGATIVE  NEGATIVE mg/dL   Hgb urine dipstick NEGATIVE  NEGATIVE   Bilirubin Urine NEGATIVE  NEGATIVE   Ketones, ur 40 (*) NEGATIVE mg/dL   Protein, ur 30 (*) NEGATIVE mg/dL   Urobilinogen, UA 0.2  0.0 - 1.0 mg/dL   Nitrite NEGATIVE  NEGATIVE   Leukocytes, UA NEGATIVE  NEGATIVE  URINE MICROSCOPIC-ADD ON     Status: Abnormal   Collection Time    06/18/13 11:50 AM      Result Value Ref Range   Squamous Epithelial / LPF MANY (*) RARE   WBC, UA 0-2  <3 WBC/hpf   RBC / HPF 0-2  <3 RBC/hpf   Pt given PO hydration and a meal > tolerated both, did not vomit.  Reports feeling better.  Assessment and Plan  48 yo G1P0 at 9165w2d wks IUP Nausea and Vomiting - resolved Category I FHR Tracing  Plan: Discharge to home Increase fluids Frequent snacks to maintain blood glucose   [facility] Center[facility]UHAMMAD,WALIDAH 06/18/2013, 11:20 AM

## 2013-06-18 NOTE — MAU Note (Signed)
Vomiting since Monday morning, unable to hold liquids down, diarrhea started Sunday night.  Denies uc's, bleeding or LOF.

## 2013-06-18 NOTE — MAU Note (Signed)
Pt denies vomiting or diarrhea this morning.

## 2013-06-18 NOTE — MAU Note (Signed)
Pt was @ chiropractor this a.m., states she "came very close to passing out."  Gestation diabetic, fasting CBG this a.m. Was 89.

## 2013-08-01 LAB — OB RESULTS CONSOLE GBS: STREP GROUP B AG: POSITIVE

## 2013-08-10 ENCOUNTER — Inpatient Hospital Stay (HOSPITAL_COMMUNITY): Payer: 59 | Admitting: Anesthesiology

## 2013-08-10 ENCOUNTER — Encounter (HOSPITAL_COMMUNITY): Payer: Self-pay

## 2013-08-10 ENCOUNTER — Encounter (HOSPITAL_COMMUNITY): Payer: 59 | Admitting: Anesthesiology

## 2013-08-10 ENCOUNTER — Inpatient Hospital Stay (HOSPITAL_COMMUNITY)
Admission: AD | Admit: 2013-08-10 | Discharge: 2013-08-13 | DRG: 775 | Disposition: A | Discharge status: Home / Self Care | Payer: 59 | Source: Ambulatory Visit | Attending: Obstetrics and Gynecology | Admitting: Obstetrics and Gynecology

## 2013-08-10 DIAGNOSIS — F319 Bipolar disorder, unspecified: Secondary | ICD-10-CM | POA: Diagnosis present

## 2013-08-10 DIAGNOSIS — O99344 Other mental disorders complicating childbirth: Secondary | ICD-10-CM | POA: Diagnosis present

## 2013-08-10 DIAGNOSIS — E079 Disorder of thyroid, unspecified: Secondary | ICD-10-CM | POA: Diagnosis present

## 2013-08-10 DIAGNOSIS — O99284 Endocrine, nutritional and metabolic diseases complicating childbirth: Secondary | ICD-10-CM

## 2013-08-10 DIAGNOSIS — O9989 Other specified diseases and conditions complicating pregnancy, childbirth and the puerperium: Secondary | ICD-10-CM

## 2013-08-10 DIAGNOSIS — O4690 Antepartum hemorrhage, unspecified, unspecified trimester: Secondary | ICD-10-CM | POA: Diagnosis present

## 2013-08-10 DIAGNOSIS — E039 Hypothyroidism, unspecified: Secondary | ICD-10-CM | POA: Diagnosis present

## 2013-08-10 DIAGNOSIS — Z22330 Carrier of Group B streptococcus: Secondary | ICD-10-CM

## 2013-08-10 DIAGNOSIS — Z823 Family history of stroke: Secondary | ICD-10-CM

## 2013-08-10 DIAGNOSIS — Z8759 Personal history of other complications of pregnancy, childbirth and the puerperium: Secondary | ICD-10-CM

## 2013-08-10 DIAGNOSIS — O99892 Other specified diseases and conditions complicating childbirth: Secondary | ICD-10-CM | POA: Diagnosis present

## 2013-08-10 LAB — CBC
HCT: 38.7 % (ref 36.0–46.0)
HEMOGLOBIN: 13.3 g/dL (ref 12.0–15.0)
MCH: 30.8 pg (ref 26.0–34.0)
MCHC: 34.4 g/dL (ref 30.0–36.0)
MCV: 89.6 fL (ref 78.0–100.0)
Platelets: 189 10*3/uL (ref 150–400)
RBC: 4.32 MIL/uL (ref 3.87–5.11)
RDW: 13.8 % (ref 11.5–15.5)
WBC: 9.1 10*3/uL (ref 4.0–10.5)

## 2013-08-10 LAB — POCT FERN TEST: POCT Fern Test: POSITIVE

## 2013-08-10 MED ORDER — LIDOCAINE HCL (PF) 1 % IJ SOLN
30.0000 mL | INTRAMUSCULAR | Status: AC | PRN
  Administered 2013-08-11: 30 mL via SUBCUTANEOUS
  Filled 2013-08-10 (×2): qty 30

## 2013-08-10 MED ORDER — CITRIC ACID-SODIUM CITRATE 334-500 MG/5ML PO SOLN: 30.0000 mL | ORAL | Status: DC | PRN

## 2013-08-10 MED ORDER — ACETAMINOPHEN 325 MG PO TABS: 650.0000 mg | ORAL_TABLET | ORAL | Status: DC | PRN

## 2013-08-10 MED ORDER — OXYCODONE-ACETAMINOPHEN 5-325 MG PO TABS: 1.0000 | ORAL_TABLET | ORAL | Status: DC | PRN

## 2013-08-10 MED ORDER — AMPICILLIN SODIUM 1 G IJ SOLR
1.0000 g | INTRAMUSCULAR | Status: DC
  Administered 2013-08-10 – 2013-08-11 (×4): 1 g via INTRAVENOUS
  Filled 2013-08-10 (×8): qty 1000

## 2013-08-10 MED ORDER — PHENYLEPHRINE 40 MCG/ML (10ML) SYRINGE FOR IV PUSH (FOR BLOOD PRESSURE SUPPORT)
80.0000 ug | PREFILLED_SYRINGE | INTRAVENOUS | Status: DC | PRN
  Filled 2013-08-10: qty 10
  Filled 2013-08-10: qty 2

## 2013-08-10 MED ORDER — NALBUPHINE HCL 10 MG/ML IJ SOLN
5.0000 mg | INTRAMUSCULAR | Status: DC | PRN
  Administered 2013-08-10: 5 mg via INTRAVENOUS
  Filled 2013-08-10: qty 1

## 2013-08-10 MED ORDER — OXYTOCIN 40 UNITS IN LACTATED RINGERS INFUSION - SIMPLE MED
1.0000 m[IU]/min | INTRAVENOUS | Status: DC
  Administered 2013-08-11: 2 m[IU]/min via INTRAVENOUS

## 2013-08-10 MED ORDER — ZOLPIDEM TARTRATE 5 MG PO TABS: 5.0000 mg | ORAL_TABLET | Freq: Every evening | ORAL | Status: DC | PRN

## 2013-08-10 MED ORDER — EPHEDRINE 5 MG/ML INJ
10.0000 mg | INTRAVENOUS | Status: DC | PRN
  Filled 2013-08-10: qty 2

## 2013-08-10 MED ORDER — OXYTOCIN 40 UNITS IN LACTATED RINGERS INFUSION - SIMPLE MED
62.5000 mL/h | INTRAVENOUS | Status: DC
  Filled 2013-08-10: qty 1000

## 2013-08-10 MED ORDER — LACTATED RINGERS IV SOLN: 500.0000 mL | INTRAVENOUS | Status: DC | PRN

## 2013-08-10 MED ORDER — LACTATED RINGERS IV SOLN: 500.0000 mL | Freq: Once | INTRAVENOUS | Status: DC

## 2013-08-10 MED ORDER — FENTANYL 2.5 MCG/ML BUPIVACAINE 1/10 % EPIDURAL INFUSION (WH - ANES)
INTRAMUSCULAR | Status: DC | PRN
  Administered 2013-08-10: 14 mL/h via EPIDURAL

## 2013-08-10 MED ORDER — LACTATED RINGERS IV SOLN
INTRAVENOUS | Status: DC
  Administered 2013-08-10 – 2013-08-11 (×2): via INTRAVENOUS

## 2013-08-10 MED ORDER — PHENYLEPHRINE 40 MCG/ML (10ML) SYRINGE FOR IV PUSH (FOR BLOOD PRESSURE SUPPORT)
80.0000 ug | PREFILLED_SYRINGE | INTRAVENOUS | Status: DC | PRN
  Filled 2013-08-10: qty 2

## 2013-08-10 MED ORDER — OXYTOCIN BOLUS FROM INFUSION
500.0000 mL | INTRAVENOUS | Status: DC
Start: 2013-08-10 — End: 2013-08-11

## 2013-08-10 MED ORDER — DIPHENHYDRAMINE HCL 50 MG/ML IJ SOLN: 12.5000 mg | INTRAMUSCULAR | Status: DC | PRN

## 2013-08-10 MED ORDER — AMPICILLIN SODIUM 2 G IJ SOLR
2.0000 g | Freq: Once | INTRAMUSCULAR | Status: AC
  Administered 2013-08-10: 2 g via INTRAVENOUS
  Filled 2013-08-10: qty 2000

## 2013-08-10 MED ORDER — ONDANSETRON HCL 4 MG/2ML IJ SOLN
4.0000 mg | Freq: Four times a day (QID) | INTRAMUSCULAR | Status: DC | PRN
  Administered 2013-08-11: 4 mg via INTRAVENOUS
  Filled 2013-08-10: qty 2

## 2013-08-10 MED ORDER — LIDOCAINE HCL (PF) 1 % IJ SOLN
INTRAMUSCULAR | Status: DC | PRN
  Administered 2013-08-10 (×4): 4 mL

## 2013-08-10 MED ORDER — FENTANYL 2.5 MCG/ML BUPIVACAINE 1/10 % EPIDURAL INFUSION (WH - ANES)
14.0000 mL/h | INTRAMUSCULAR | Status: DC | PRN
  Administered 2013-08-11 (×2): 14 mL/h via EPIDURAL
  Filled 2013-08-10 (×3): qty 125

## 2013-08-10 MED ORDER — IBUPROFEN 600 MG PO TABS
600.0000 mg | ORAL_TABLET | Freq: Four times a day (QID) | ORAL | Status: DC | PRN
  Administered 2013-08-11: 600 mg via ORAL
  Filled 2013-08-10: qty 1

## 2013-08-10 MED ORDER — EPHEDRINE 5 MG/ML INJ
10.0000 mg | INTRAVENOUS | Status: DC | PRN
  Filled 2013-08-10: qty 2
  Filled 2013-08-10: qty 4

## 2013-08-10 MED ORDER — TERBUTALINE SULFATE 1 MG/ML IJ SOLN: 0.2500 mg | Freq: Once | INTRAMUSCULAR | Status: AC | PRN

## 2013-08-10 NOTE — MAU Note (Signed)
Per HHogan, CNM, [facility]

## 2013-08-10 NOTE — H&P (Signed)
Alyssa [name] is a 48 y.o. female presenting for bleeding  48 yo G1P0 @ 37+6 presents for c/o back pain and bleeding. In MAU she was visibly uncomfortable and a difficult check but she was felt to be complete. Pt was in knee chest during exam which limited the exam. Per report she has spontaneously ruptured membranes. Her pregnancy has been complicated by A1DM.  History OB History   Grav Para Term Preterm Abortions TAB SAB Ect Mult Living   1              Past Medical History  Diagnosis Date  . Depression   . Bipolar disorder   . Migraine   . Anemia remote past    nutritional; slow Fe  . Fainting spell   . Frequent headaches   . Hypothyroidism   . Gestational diabetes mellitus, antepartum    Past Surgical History  Procedure Laterality Date  . None    . No past surgeries     Family History: family history includes Alcohol abuse in her paternal grandfather; Cancer in her sister; Stroke in her maternal grandmother. There is no history of Mental retardation, Diabetes, or Heart disease. Social History:  reports that she has never smoked. She has never used smokeless tobacco. She reports that she does not drink alcohol or use illicit drugs.   Prenatal Transfer Tool  Maternal Diabetes: Yes:  Diabetes Type:  Diet controlled Genetic Screening: Normal Maternal Ultrasounds/Referrals: Normal Fetal Ultrasounds or other Referrals:  None Maternal Substance Abuse:  No Significant Maternal Medications:  None Significant Maternal Lab Results:  Lab values include: Group B Strep positive Other Comments:  None  ROS: as abovwe  Dilation: Lip/rim Effacement (%): 100 Station: 0 Exam by:: H. Hogan CNM Blood pressure 137/84, pulse 92, resp. rate 20, height 5' (1.524 m), weight 57.607 kg (127 lb). Exam Physical Exam  Prenatal labs: ABO, Rh: O/Positive/-- (04/10 0000) Antibody: Negative (04/10 0000) Rubella: Nonimmune (04/10 0000) RPR: Nonreactive (04/10 0000)  HBsAg: Negative (04/10  0000)  HIV: Non-reactive (04/10 0000)  GBS: Positive (07/30 0000)   Assessment/Plan: 1) Admit 2) Amp 3) Epidural   Nero Sawatzky H. 08/10/2013, 7:44 PM

## 2013-08-10 NOTE — Progress Notes (Signed)
Patient ID: Alyssa [name], female   DOB: 06-20-1965, 48 y.o.   MRN: [id number]   S: Pt more comfortable after epidural but still with pain in the RLQ O: Filed Vitals:   08/10/13 2010 08/10/13 2011 08/10/13 2013 08/10/13 2015  BP: 112/57 112/57 112/57 112/60  Pulse: 33 70 70 78  Resp: 20   18  Height:      Weight:      SpO2: 78%  78%    FHT 115 reactive, category 1 tracing Cvx: Closed/complete/0 Toco: Q 2 minutes  After cervix exam pt digitally stretched to 5/C/0 w/ BBOW AROM clear fluid  A/P 1) Continue amp 2) FWB reassuring

## 2013-08-10 NOTE — Anesthesia Preprocedure Evaluation (Signed)
Anesthesia Evaluation  Patient identified by MRN, date of birth, ID band Patient awake    Reviewed: Allergy & Precautions, H&P , NPO status , Patient's Chart, lab work & pertinent test results, reviewed documented beta blocker date and time   History of Anesthesia Complications Negative for: history of anesthetic complications  Airway Mallampati: II TM Distance: >3 FB Neck ROM: full    Dental  (+) Teeth Intact   Pulmonary neg pulmonary ROS,  breath sounds clear to auscultation        Cardiovascular negative cardio ROS  Rhythm:regular Rate:Normal     Neuro/Psych  Headaches (frequent), Depression Bipolar Disorder syncope    GI/Hepatic negative GI ROS, Neg liver ROS,   Endo/Other  diabetes (diet-controlled), GestationalHypothyroidism (h/o, no meds)   Renal/GU negative Renal ROS     Musculoskeletal "amplified musculoskeletal pain syndrome"   Abdominal   Peds  Hematology negative hematology ROS (+)   Anesthesia Other Findings   Reproductive/Obstetrics (+) Pregnancy                           Anesthesia Physical Anesthesia Plan  ASA: II  Anesthesia Plan: Epidural   Post-op Pain Management:    Induction:   Airway Management Planned:   Additional Equipment:   Intra-op Plan:   Post-operative Plan:   Informed Consent: I have reviewed the patients History and Physical, chart, labs and discussed the procedure including the risks, benefits and alternatives for the proposed anesthesia with the patient or authorized representative who has indicated his/her understanding and acceptance.     Plan Discussed with:   Anesthesia Plan Comments:         Anesthesia Quick Evaluation

## 2013-08-10 NOTE — MAU Note (Signed)
Pt states here for ctx's q5 minutes apart. Mucus d/c for days. Notes bloody show as well

## 2013-08-10 NOTE — Anesthesia Procedure Notes (Signed)
Epidural Patient location during procedure: OB Start time: 08/10/2013 7:48 PM  Staffing Performed by: anesthesiologist   Preanesthetic Checklist Completed: patient identified, site marked, surgical consent, pre-op evaluation, timeout performed, IV checked, risks and benefits discussed and monitors and equipment checked  Epidural Patient position: sitting Prep: site prepped and draped and DuraPrep Patient monitoring: continuous pulse ox and blood pressure Approach: midline Injection technique: LOR air  Needle:  Needle type: Tuohy  Needle gauge: 17 G Needle length: 9 cm and 9 Needle insertion depth: 4 cm Catheter type: closed end flexible Catheter size: 19 Gauge Catheter at skin depth: 9 cm Test dose: negative  Assessment Events: blood not aspirated, injection not painful, no injection resistance, negative IV test and no paresthesia  Additional Notes Discussed risk of headache, infection, bleeding, nerve injury and failed or incomplete block.  Patient voices understanding and wishes to proceed.  Epidural placed easily on first attempt (once patient ready to begin).  No paresthesia.  Patient tolerated procedure well, no apparent complications.  Jasmine DecemberA. Tanisa Lagace, MDReason for block:procedure for pain

## 2013-08-10 NOTE — MAU Note (Signed)
Notified HMitchell, RN charge pt complete, will take to room 163.

## 2013-08-11 ENCOUNTER — Encounter (HOSPITAL_COMMUNITY): Payer: Self-pay | Admitting: *Deleted

## 2013-08-11 DIAGNOSIS — Z8759 Personal history of other complications of pregnancy, childbirth and the puerperium: Secondary | ICD-10-CM

## 2013-08-11 HISTORY — DX: Personal history of other complications of pregnancy, childbirth and the puerperium: Z87.59

## 2013-08-11 LAB — RPR

## 2013-08-11 LAB — GLUCOSE, CAPILLARY: Glucose-Capillary: 88 mg/dL (ref 70–99)

## 2013-08-11 LAB — ABO/RH: ABO/RH(D): O POS

## 2013-08-11 MED ORDER — METHYLERGONOVINE MALEATE 0.2 MG PO TABS: 0.2000 mg | ORAL_TABLET | ORAL | Status: DC | PRN

## 2013-08-11 MED ORDER — ZOLPIDEM TARTRATE 5 MG PO TABS: 5.0000 mg | ORAL_TABLET | Freq: Every evening | ORAL | Status: DC | PRN

## 2013-08-11 MED ORDER — OXYCODONE-ACETAMINOPHEN 5-325 MG PO TABS
1.0000 | ORAL_TABLET | ORAL | Status: DC | PRN
  Administered 2013-08-11: 1 via ORAL
  Administered 2013-08-12 (×2): 2 via ORAL
  Filled 2013-08-11 (×2): qty 2
  Filled 2013-08-11 (×2): qty 1

## 2013-08-11 MED ORDER — MISOPROSTOL 200 MCG PO TABS
ORAL_TABLET | ORAL | Status: AC
  Administered 2013-08-11: 1000 ug
  Filled 2013-08-11: qty 5

## 2013-08-11 MED ORDER — SIMETHICONE 80 MG PO CHEW: 80.0000 mg | CHEWABLE_TABLET | ORAL | Status: DC | PRN

## 2013-08-11 MED ORDER — LANOLIN HYDROUS EX OINT: TOPICAL_OINTMENT | CUTANEOUS | Status: DC | PRN

## 2013-08-11 MED ORDER — BENZOCAINE-MENTHOL 20-0.5 % EX AERO
1.0000 "application " | INHALATION_SPRAY | CUTANEOUS | Status: DC | PRN
  Filled 2013-08-11: qty 56

## 2013-08-11 MED ORDER — PRENATAL MULTIVITAMIN CH
1.0000 | ORAL_TABLET | Freq: Every day | ORAL | Status: DC
  Administered 2013-08-12 – 2013-08-13 (×2): 1 via ORAL
  Filled 2013-08-11 (×2): qty 1

## 2013-08-11 MED ORDER — ONDANSETRON HCL 4 MG/2ML IJ SOLN: 4.0000 mg | INTRAMUSCULAR | Status: DC | PRN

## 2013-08-11 MED ORDER — IBUPROFEN 600 MG PO TABS
600.0000 mg | ORAL_TABLET | Freq: Four times a day (QID) | ORAL | Status: DC
  Administered 2013-08-11 – 2013-08-13 (×7): 600 mg via ORAL
  Filled 2013-08-11 (×7): qty 1

## 2013-08-11 MED ORDER — MEASLES, MUMPS & RUBELLA VAC ~~LOC~~ INJ
0.5000 mL | INJECTION | Freq: Once | SUBCUTANEOUS | Status: AC
  Administered 2013-08-13: 0.5 mL via SUBCUTANEOUS
  Filled 2013-08-11: qty 0.5

## 2013-08-11 MED ORDER — TETANUS-DIPHTH-ACELL PERTUSSIS 5-2.5-18.5 LF-MCG/0.5 IM SUSP
0.5000 mL | Freq: Once | INTRAMUSCULAR | Status: AC
  Administered 2013-08-13: 0.5 mL via INTRAMUSCULAR
  Filled 2013-08-11: qty 0.5

## 2013-08-11 MED ORDER — DIPHENHYDRAMINE HCL 25 MG PO CAPS: 25.0000 mg | ORAL_CAPSULE | Freq: Four times a day (QID) | ORAL | Status: DC | PRN

## 2013-08-11 MED ORDER — WITCH HAZEL-GLYCERIN EX PADS: 1.0000 "application " | MEDICATED_PAD | CUTANEOUS | Status: DC | PRN

## 2013-08-11 MED ORDER — DIBUCAINE 1 % RE OINT: 1.0000 "application " | TOPICAL_OINTMENT | RECTAL | Status: DC | PRN

## 2013-08-11 MED ORDER — SODIUM BICARBONATE 8.4 % IV SOLN
INTRAVENOUS | Status: DC | PRN
  Administered 2013-08-11 (×2): 5 mL via EPIDURAL

## 2013-08-11 MED ORDER — METHYLERGONOVINE MALEATE 0.2 MG/ML IJ SOLN
INTRAMUSCULAR | Status: AC
  Administered 2013-08-11: 0.2 mg
  Filled 2013-08-11: qty 1

## 2013-08-11 MED ORDER — METHYLERGONOVINE MALEATE 0.2 MG/ML IJ SOLN: 0.2000 mg | INTRAMUSCULAR | Status: DC | PRN

## 2013-08-11 MED ORDER — VENLAFAXINE HCL ER 75 MG PO CP24
75.0000 mg | ORAL_CAPSULE | Freq: Every day | ORAL | Status: DC
  Administered 2013-08-12 – 2013-08-13 (×2): 75 mg via ORAL
  Filled 2013-08-11 (×2): qty 1

## 2013-08-11 MED ORDER — ONDANSETRON HCL 4 MG PO TABS: 4.0000 mg | ORAL_TABLET | ORAL | Status: DC | PRN

## 2013-08-11 MED ORDER — SENNOSIDES-DOCUSATE SODIUM 8.6-50 MG PO TABS
2.0000 | ORAL_TABLET | ORAL | Status: DC
  Administered 2013-08-11 – 2013-08-12 (×2): 2 via ORAL
  Filled 2013-08-11 (×3): qty 2

## 2013-08-11 NOTE — Lactation Note (Signed)
This note was copied from the chart of [name] Janus. Lactation Consultation Note  Patient Name: [name] Hoot ZOXWR'UToday's Date: 08/11/2013 Reason for consult: Initial assessment of this mother/baby dyad at 7 hours postpartum.  Baby having first bath and mom says she is exhausted but may want LC to assist with feeding later tonight if she is awake. Mom states her nurse has shown her how to use hand expression to express drops of colostrum.  Baby had unsuccessful first breastfeeding attempt and mom had only provided 5 minutes of STS after delivery and feeding was delayed over 3 hours (no reason documented).  When baby did not latch, mom requested formula and fed baby 15 ml's.  LC discussed benefits of frequent STS and cue feedings. LC encouraged review of Baby and Me pp 9, 14 and 20-25 for STS and BF information. LC provided [facility] Resource brochure and reviewed [facility] services and list of community and web site resources.     Maternal Data Formula Feeding for Exclusion: No Has patient been taught Hand Expression?: Yes (mom says her nurse has shown her hand expression) Does the patient have breastfeeding experience prior to this delivery?: No  Feeding    LATCH Score/Interventions         Initial LATCH score=4             Lactation Tools Discussed/Used   STS, hand expression, cue feedings  Consult Status Consult Status: Follow-up Date: 08/12/13 Follow-up type: In-patient    Warrick ParisianBryant, Kenon Delashmit [facility] 08/11/2013, 8:58 PM

## 2013-08-12 LAB — CBC
HCT: 25.6 % — ABNORMAL LOW (ref 36.0–46.0)
HEMOGLOBIN: 8.6 g/dL — AB (ref 12.0–15.0)
MCH: 30.2 pg (ref 26.0–34.0)
MCHC: 33.6 g/dL (ref 30.0–36.0)
MCV: 89.8 fL (ref 78.0–100.0)
PLATELETS: 188 10*3/uL (ref 150–400)
RBC: 2.85 MIL/uL — AB (ref 3.87–5.11)
RDW: 14 % (ref 11.5–15.5)
WBC: 12.5 10*3/uL — ABNORMAL HIGH (ref 4.0–10.5)

## 2013-08-12 NOTE — Lactation Note (Addendum)
This note was copied from the chart of Alyssa Lynnae Sandhoffonya Jarquin. Lactation Consultation Note  Patient Name: Alyssa [name] WUJWJ'XToday's Date: 08/12/2013 Reason for consult: Follow-up assessment Mom resting and baby asleep when I arrived. [facility] Mom was c/o of nipple tenderness. Comfort gels given to Mom with instructions per RN request. BF basics reviewed with Mom. Advised Mom baby should be at the breast 8-12 times in 24 hours for > 10 minutes now that she is over 24 hours old. Mom reports understanding.  Encouraged Mom to call with next feeding for assist due to nipple tenderness. [facility] Mom needed a lot of assist with latch with last feeding.   Maternal Data    Feeding Feeding Type: Breast Fed Length of feed: 12 min  LATCH Score/Interventions Latch: Repeated attempts needed to sustain latch, nipple held in mouth throughout feeding, stimulation needed to elicit sucking reflex. Intervention(s): Teach feeding cues  Audible Swallowing: A few with stimulation Intervention(s): Hand expression  Type of Nipple: Everted at rest and after stimulation  Comfort (Breast/Nipple): Filling, red/small blisters or bruises, mild/mod discomfort  Problem noted: Mild/Moderate discomfort Interventions (Mild/moderate discomfort): Comfort gels  Hold (Positioning): Assistance needed to correctly position infant at breast and maintain latch.  LATCH Score: 7  Lactation Tools Discussed/Used Tools: Comfort gels   Consult Status Consult Status: Follow-up Date: 08/12/13 Follow-up type: In-patient    Alfred LevinsGranger, Shenise Wolgamott Ann 08/12/2013, 3:38 PM

## 2013-08-12 NOTE — Progress Notes (Signed)
Clinical Social Work Department PSYCHOSOCIAL ASSESSMENT - MATERNAL/CHILD 08/12/2013  Patient:  Holik,Khrystina B  Account Number:  [id number]  Admit Date:  08/10/2013  Childs Name:   Sophie Elizabeth Yeakel    Clinical Social Worker:  Latiesha Harada, CLINICAL SOCIAL WORKER   Date/Time:  08/12/2013 09:15 AM  Date Referred:  08/12/2013   Referral source  Central Nursery     Referred reason  Depression/Anxiety   Other referral source:    I:  FAMILY / HOME ENVIRONMENT Child's legal guardian:  PARENT  Guardian - Name Guardian - Age Guardian - Address  Blimie Akter 47 [address] [place], [place] [address]  Randy Lage  same as above   Other household support members/support persons Other support:   MOB and FOB reported that they have numerous family members who live in the area and are supporitve.  They shared that they also have many friends, including a neighbor that are included in their support system.    II  PSYCHOSOCIAL DATA Information Source:  Family Interview  Financial and Community Resources Employment:   MOB is unemployed.  FOB is a manager of a hotel in [place].   Financial resources:  Private Insurance If Medicaid - County:    School / Grade:  N/A Maternity Care Coordinator / Child Services Coordination / Early Interventions:   N/A  Cultural issues impacting care:   None reported    III  STRENGTHS Strengths  Home prepared for Child (including basic supplies)  Adequate Resources  Supportive family/friends   Strength comment:    IV  RISK FACTORS AND CURRENT PROBLEMS Current Problem:  YES   Risk Factor & Current Problem Patient Issue Family Issue Risk Factor / Current Problem Comment  Mental Illness N N MOB reported diagnosis of depression. She shared that she is currently prescribed Effexor and is seen every 6 months by a psychiatrist.    V  SOCIAL WORK ASSESSMENT CSW met with MOB in her room in order to complete assessment.  Consult  ordered due to MOB's history of bipolar and depression.  MOB and FOB receptive to completing the assessment, but MOB was more inattentive as evidenced by frequently moving around the room.  MOB and FOB answered questions openly, but MOB became more guarded as CSW attempted to inquire about mental health history.  CSW noted that MOB and FOB were attentive to baby Sophie, CSW noted no acute symptoms or stressors.   MOB and FOB expressed excitement upon having a baby and becoming first time parents as they had been trying for years prior to conceiving Sophie.  They discussed the surprise they felt when they learned that the MOB was pregnant due to her age and due to the thought that MOB was experiencing other medical complications instead of pregnancy. MOB and FOB openly discussed the shock they felt when they discovered that she was pregnant when she was 4 1/2 months into her pregnancy, and how the shock quickly became excitement and joy.  MOB shared that family and friends have been supportive and are offering assistance as they adjust to becoming parents.  FOB will be returning to work in the next 1-2 weeks and MOB will be staying at home as she is unemployed.    CSW discussed normative symptoms of post-partum anxiety due to becoming parents for the first time. FOB shared that he has already noted that he is more anxious.  CSW validated his feelings and continued to discuss the reason behind the anxiety.  CSW   provided education on post-partum depression.  MOB and FOB receptive to the education, and shared that they are willing to notify their psychiatrist if symptoms occur.  CSW highlighted risk and protective factors for post-partum depression.  FOB and MOB acknowledged increased risk due to MOB's mental health history.   As noted above, MOB became more closed and guarded as CSW inquired about mental health history.  MOB admits to a history of depression, but did not disclose when symptoms began.  She denied  acute symptoms, shared that she has been compliant with Effexor throughout her pregnancy.  She shared that she was previously prescribed Lamictal, but that it was discontinued while pregnant.  She reported no intentions to re-start Lamictal and shared that she will maintain current dosage of Effexor (Effexor had been decreased during pregnancy).  She stated that she will follow-up with her psychiatrist to continue to discuss medications if they need adjustment during post-partum period.    MOB and FOB aware of CSW availability if needed.    No barriers to discharge.   VI SOCIAL WORK PLAN Social Work Therapist, art  No Further Intervention Required / No Barriers to Discharge   Type of pt/family education:   Post-partrum depression and anxiety   If child protective services report - county:   If child protective services report - date:   Information/referral to community resources comment:   N/A   Other social work plan:   Ongoing emotional support if needed.

## 2013-08-12 NOTE — Progress Notes (Signed)
Post Partum Day 1 (VAVD complicated by 4th) Subjective: up ad lib, voiding, tolerating PO and feels perineal pain/ discomfort no BM  Objective: Blood pressure 120/67, pulse 105, temperature 97.7 F (36.5 C), temperature source Oral, resp. rate 18, height 5' (1.524 m), weight 57.607 kg (127 lb), SpO2 100.00%, unknown if currently breastfeeding.  Physical Exam:  General: alert, cooperative and no distress Lochia: appropriate Uterine Fundus: firm Incision: no dehiscence, no significant erythema DVT Evaluation: No evidence of DVT seen on physical exam. Negative Homan's sign. No cords or calf tenderness.   Recent Labs  08/10/13 1905 08/12/13 0554  HGB 13.3 8.6*  HCT 38.7 25.6*    Assessment/Plan:  We reviewed pain management she says it is good. Will continue bowel regimen (stool softeners) Plan for discharge tomorrow, Breastfeeding and Social Work consult done   LOS: 2 days   Alyssa [name] STACIA 08/12/2013, 5:55 PM

## 2013-08-12 NOTE — Anesthesia Postprocedure Evaluation (Signed)
Anesthesia Post Note  Patient: Alyssa [name]  Procedure(s) Performed: * No procedures listed *  Anesthesia type: Epidural  Patient location: Mother/Baby  Post pain: Pain level controlled  Post assessment: Post-op Vital signs reviewed  Last Vitals:  Filed Vitals:   08/12/13 0500  BP: 120/67  Pulse: 105  Temp: 36.5 C  Resp: 18    Post vital signs: Reviewed  Level of consciousness: awake  Complications: No apparent anesthesia complications

## 2013-08-13 MED ORDER — DOCUSATE SODIUM 100 MG PO CAPS: 100.0000 mg | ORAL_CAPSULE | Freq: Three times a day (TID) | ORAL | Status: DC

## 2013-08-13 MED ORDER — OXYCODONE-ACETAMINOPHEN 5-325 MG PO TABS: 1.0000 | ORAL_TABLET | ORAL | Status: DC | PRN

## 2013-08-13 NOTE — Progress Notes (Signed)
Patient is eating, ambulating, voiding.  Pain control is good.  Filed Vitals:   08/11/13 2138 08/12/13 0500 08/12/13 1757 08/13/13 0549  BP: 143/65 120/67 111/69 96/76  Pulse: 120 105 90 100  Temp: 98.9 F (37.2 C) 97.7 F (36.5 C) 98 F (36.7 C) 97.7 F (36.5 C)  TempSrc: Oral Oral Oral Oral  Resp: $Remo'18 18 18 18  'rlCFK$ Height:      Weight:      SpO2: 100%   100%    Fundus firm Perineum without swelling.  Lab Results  Component Value Date   WBC 12.5* 08/12/2013   HGB 8.6* 08/12/2013   HCT 25.6* 08/12/2013   MCV 89.8 08/12/2013   PLT 188 08/12/2013    --/--/O POS (08/08 1905)/RNI  A/P Post partum day 2 with 4th degree laceration- pain control better but no BM yet.  Needs MMR.  Routine care.  Expect d/c today- baby may need to stay for jaundice- can sign out room to baby if needed.  Amelia Burgard A

## 2013-08-13 NOTE — Plan of Care (Signed)
Problem: Discharge Progression Outcomes Goal: MMR given as ordered Outcome: Not Met (add Reason) Needs MMR     

## 2013-08-13 NOTE — Discharge Summary (Addendum)
Obstetric Discharge Summary Reason for Admission: onset of labor, SROM Prenatal Procedures: NST Intrapartum Procedures: vacuum Postpartum Procedures: Rubella Ig Complications-Operative and Postpartum: fourth degree perineal laceration Hemoglobin  Date Value Ref Range Status  08/12/2013 8.6* 12.0 - 15.0 g/dL Final     DELTA CHECK NOTED     REPEATED TO VERIFY     HCT  Date Value Ref Range Status  08/12/2013 25.6* 36.0 - 46.0 % Final   Discharge Diagnoses: Term Pregnancy-delivered  Discharge Information: Date: 08/13/2013 Activity: pelvic rest Diet: routine Medications: PNV, Ibuprofen, Colace and Percocet Condition: stable Instructions: refer to practice specific booklet Discharge to: home Follow-up Information   Follow up with Almon HerculesOSS,KENDRA H., MD In 4 weeks.   Specialty:  Obstetrics and Gynecology   Contact information:   [address] [place] [id number] [phone]       Newborn Data: Live born female  Birth Weight: 7 lb 8.5 oz (3416 g) APGAR: 8, 9  Home with mother.  Tierney Behl A 08/13/2013, 8:00 AM

## 2013-08-16 ENCOUNTER — Ambulatory Visit (HOSPITAL_COMMUNITY)
Admission: RE | Admit: 2013-08-16 | Discharge: 2013-08-16 | Disposition: A | Discharge status: Home / Self Care | Payer: 59 | Source: Ambulatory Visit | Attending: Obstetrics and Gynecology | Admitting: Obstetrics and Gynecology

## 2013-08-16 NOTE — Lactation Note (Addendum)
Outpatient Lactation Consult  Mother's reason for visit:  Pediatrician referral, weight loss, and difficulty with breast feeding Visit Type:  Feeding assessment Appointment Notes: DOB- [birth year] Birth weight- 7 lbs 8 oz Discharge weight- 7 lbs 1.6 oz (5.7%) Today's weight- 7 lbs. 0.9 oz Consult:  Initial Lactation Consultant:  Broadus John  ________________________________________________________________________ Alyssa [name] was referred today 08/16/13 from Pediatrician's office due to Mother having difficulty with breast feeding.  FOB accompanied Mom and baby. Mom anxious and frustrated when [facility] first met the family.  Mom stated she was being given conflicting advice on how to manage her breast feeding/pumping.  Per Mom, she was told on discharge from hospital to pump 4 times a day for 5 minutes.  She states that she only ever got drops when pumping.  She pumped last yesterday evening, almost 24 hrs ago.  She has been feeding baby 1 oz formula by bottle every 3 hrs.    Before starting the consult, I asked Mom how she would like to feed her baby, and how I can help her.  She said she wanted to breast feed her baby.  Mom has a Medela Pump in Style at home, but after talking with Mom, the bottles were not connected when she was pumping.  Explained in detail, showing examples, on how to connect the flange, with valve and membrane, and bottles to pump.  Explained the importance of having a closed tight connection in order to obtain suction  Talked about how important it was to either have Alyssa [name] breastfeeding and transferring milk, or pumping regularly 8-12 times in 24 hrs.  Tavonna appeared to relax more after we talked, and became very appreciative of my help.        ________________________________________________________________________  Mother's Name: Alyssa [name] Type of delivery:  Vaginal (vacuum assisted) Breastfeeding Experience:  none Maternal Medical Conditions:  Thyroid,  Gestational diabetes mellitis and depression/bipolar  Maternal Medications:  See Mother's chart  ________________________________________________________________________  Breastfeeding History (Post Discharge)  Frequency of breastfeeding:  4 times a day Duration of feeding:  Varies from 0-5 mins  Supplementation  Formula:  Volume 11ml Frequency:  3 hrs Total volume per day:  248ml       Brand: Enfamil  Breastmilk:  Volume 92ml Frequency:  N/A Total volume per day:  12ml  Method:  Bottle  Infant Intake and Output Assessment  Voids:  QS in 24 hrs.  Color:  Clear yellow Stools: QS in 24 hrs.  Color:  Brown  ________________________________________________________________________  Maternal Breast Assessment  Breast:  Full, slightly engorged Nipple:  Erect (short shaft) Pain level:  2 Pain interventions:  Cold packs  _______________________________________________________________________ Feeding Assessment/Evaluation  Initial feeding assessment:  Infant's oral assessment:  WNL  Positioning:  Football Left breast  LATCH documentation:  Latch:  1 = Repeated attempts needed to sustain latch, nipple held in mouth throughout feeding, stimulation needed to elicit sucking reflex.  Audible swallowing:  1 = A few with stimulation  Type of nipple:  2 = Everted at rest and after stimulation nipple shaft short  Comfort (Breast/Nipple):  1= full, slightly engorged  Hold (Positioning):  0 = Full assist, staff holds infant at breast  LATCH score:  5  Attached assessment:  Shallow  Lips flanged:  No.  Lips untucked:  Yes.    Suck assessment:  Displays both  Tools:  Nipple shield 20 mm Instructed on use and cleaning of tool:  Yes.    Pre-feed weight:  3200 g  (  7 lb. .9 oz.) Post-feed weight:  3202 g (7 lb. 9 oz.) Amount transferred:  2 ml Amount supplemented:  2 ml  No  Total amount pumped post feed:   Mom did not pump  Total amount transferred:  2 ml Total supplement  given:  60 ml  formula  Assessment- Mom stated she wasn't using any support when trying to breast feed baby.  Talked about the benefits of using pillow/blanket support when positioning baby.  Used football hold, and baby rooting and tried to latch on.  Basics instructed to Mom as she was trying to latch baby on the nipple prior to baby opening her mouth.  Mom's breasts are so full, nipple shortened, baby unable to attain a deep latch.  Initiated a nipple shield (7m) and instructed how to apply.  Mom's breasts easily expressed milk into shield.  Baby able to latch, but tucks her lips in.  Demonstrated how to un tuck to facilitate a better seal.  A Few swallows heard, but baby tended to fall asleep, needing stimulation. (baby fed formula 1oz 1 hr prior).  Mom return demonstrated how to apply the nipple shield.  Introduced 2-3 ml of formula into shield to entice baby.  Encouraged and demonstrated to Mom how to use alternate breast compression to increase milk flow.  Breast milk noted in shield after baby came off.  A few swallows heard.  Explained to Mom and FOB, the most important thing now, is to start regular pumping, with some ice packs (20 mins) between pumping.  Without a flow of milk, baby is not going to continue being nutritive.  She has been fed primarily bottles since discharge.  Showed FOB how to feed baby the bottle, to help teach baby to open her mouth widely, and un tuck her lips on the base of the nipple.  LC did not choose to prolong the OP appointment anymore as Mom was "exhausted".  (Did not bring her pump) Mom made aware of O/P services, breastfeeding support groups, community resources, and our phone # for post-discharge questions.   Plan _ 1- Offer breast with nipple shield, when baby cues, or every 2-3 hrs.  (pre-pump to pull nipple out as needed) 2- Follow breast with 45-75 ml formula +/ breast milk by slow flow bottle 3- Pump both breasts 20 minutes every 2-3 hrs. 4- ice packs  between pumping next 24 hrs.   5- Skin to skin as much as possible 6- Rest as much as possible 7- keep a feeding diary 8- Follow up Monday, August 24 th at 2:30pm

## 2013-08-21 ENCOUNTER — Inpatient Hospital Stay (HOSPITAL_COMMUNITY): Admission: RE | Admit: 2013-08-21 | Payer: 59 | Source: Ambulatory Visit

## 2013-08-26 ENCOUNTER — Ambulatory Visit (HOSPITAL_COMMUNITY)
Admission: RE | Admit: 2013-08-26 | Discharge: 2013-08-26 | Disposition: A | Discharge status: Home / Self Care | Payer: 59 | Source: Ambulatory Visit | Attending: Obstetrics and Gynecology | Admitting: Obstetrics and Gynecology

## 2013-11-04 ENCOUNTER — Encounter (HOSPITAL_COMMUNITY): Payer: Self-pay | Admitting: *Deleted

## 2013-12-25 IMAGING — CR DG FOOT COMPLETE 3+V*R*
3 series · 3 of 3 positions shown · non-contrast
Comparison: None.

CLINICAL DATA: Chronic right foot pain.

EXAM:
RIGHT FOOT COMPLETE - 3+ VIEW

[t foot ap right]
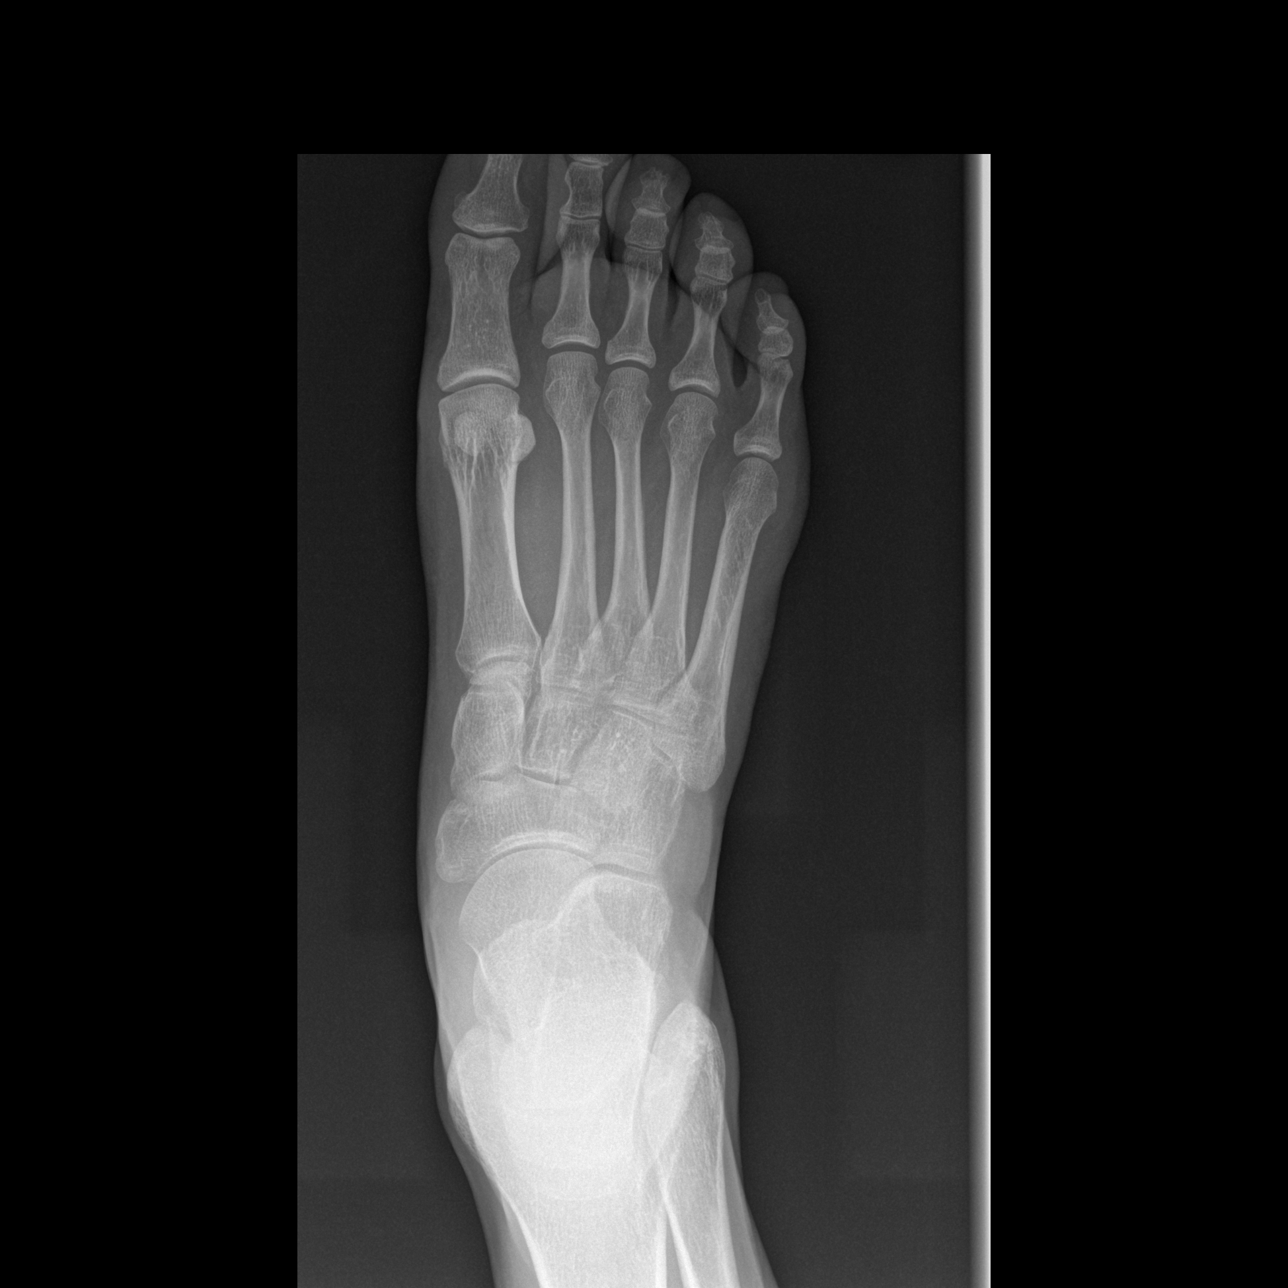

[t foot oblique right]
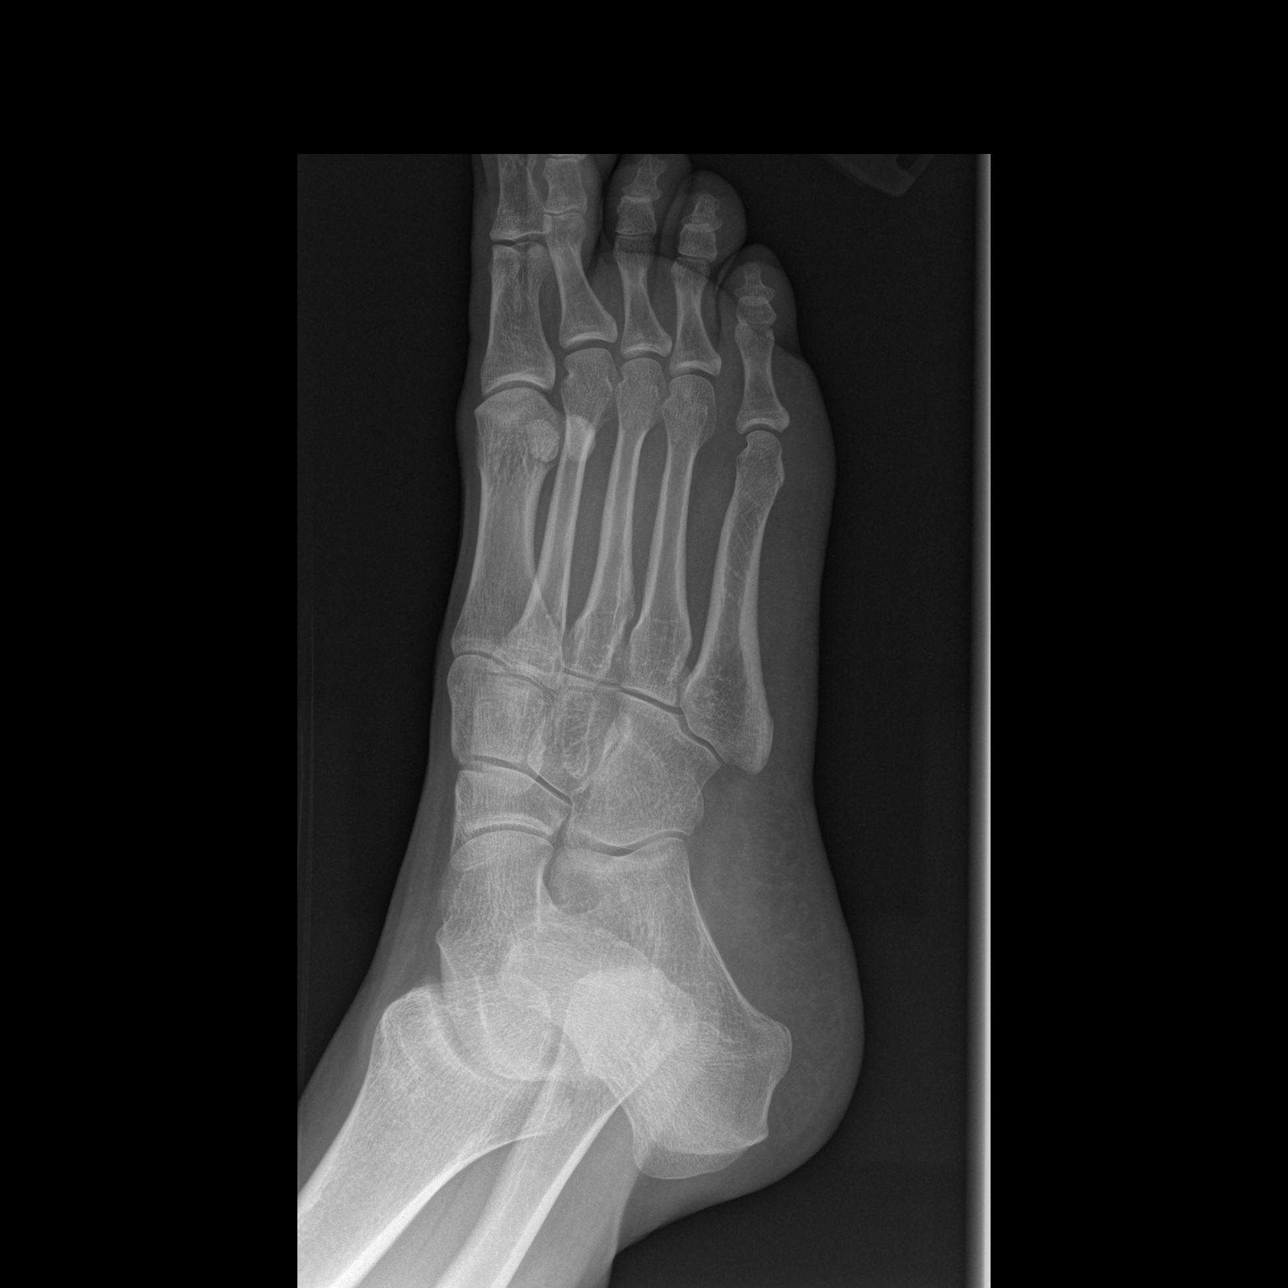

[t foot lat right]
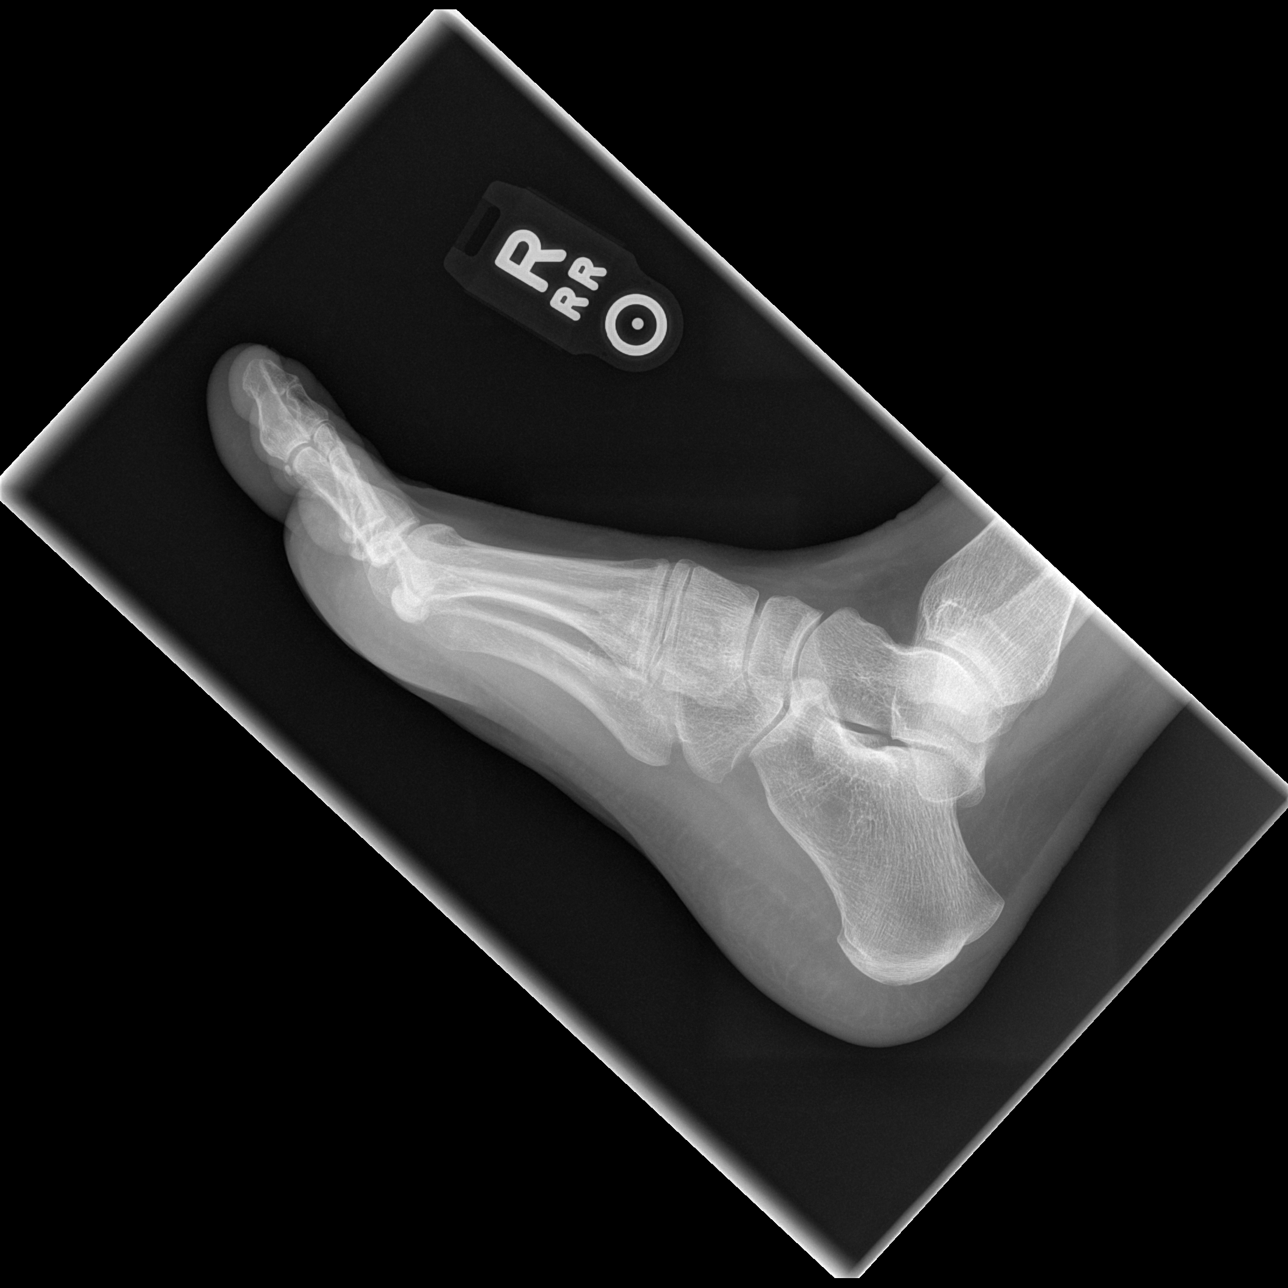

[3 of 3 positions shown; findings below may reference images not displayed]

FINDINGS: There is no evidence of fracture or dislocation. There is no
evidence of arthropathy or other focal bone abnormality. Soft
tissues are unremarkable.
IMPRESSION: Negative.

## 2014-01-13 ENCOUNTER — Ambulatory Visit (INDEPENDENT_AMBULATORY_CARE_PROVIDER_SITE_OTHER): Payer: 59 | Admitting: Family Medicine

## 2014-01-13 ENCOUNTER — Encounter: Payer: Self-pay | Admitting: Family Medicine

## 2014-01-13 VITALS — BP 132/82 | HR 85 | Temp 98.4°F | Resp 18 | Ht 60.5 in | Wt 113.0 lb

## 2014-01-13 DIAGNOSIS — J018 Other acute sinusitis: Secondary | ICD-10-CM

## 2014-01-13 DIAGNOSIS — M654 Radial styloid tenosynovitis [de Quervain]: Secondary | ICD-10-CM

## 2014-01-13 MED ORDER — CETIRIZINE HCL 10 MG PO TABS: 10.0000 mg | ORAL_TABLET | Freq: Every day | ORAL | Status: AC

## 2014-01-13 MED ORDER — AZITHROMYCIN 250 MG PO TABS: ORAL_TABLET | ORAL | Status: DC

## 2014-01-13 NOTE — Patient Instructions (Signed)
Trial of mucinex DM or robitussin DM otc as directed on the box. May use OTC nasal saline spray or irrigation solution bid.  

## 2014-01-13 NOTE — Progress Notes (Signed)
OFFICE NOTE  01/13/2014  CC:  Chief Complaint  Patient presents with  . Nasal Congestion    x 8-9 days  . Cough  . Wrist Pain    x early December     HPI: Patient is a 49 y.o. Caucasian female who is here for resp complaints. Onset 8-9 days ago, cough, sneezing, nasal congestion, cough somewhat productive last 24h.  No wheezing or chest tightness.  No fever.  No face pain or upper teeth pain.  +HA. Husband has recently had significant prolonged resp illness.  Also 1 mo of pain in left wrist area, no trauma or injury preceding the pain.  Squeezing/picking things up hurts it.   Pertinent PMH:  Past medical, surgical, social, and family history reviewed and changes were noted since last office visit.  MEDS:  Not taking zyrtec or oxycodone listed below. Outpatient Prescriptions Prior to Visit  Medication Sig Dispense Refill  . calcium carbonate (TUMS - DOSED IN MG ELEMENTAL CALCIUM) 500 MG chewable tablet Chew 2 tablets by mouth daily as needed for indigestion or heartburn (nausea).    . cetirizine (ZYRTEC) 10 MG tablet Take 10 mg by mouth daily.    Marland Kitchen. venlafaxine XR (EFFEXOR-XR) 75 MG 24 hr capsule Take 75 mg by mouth daily with breakfast.    . docusate sodium (COLACE) 100 MG capsule Take 1 capsule (100 mg total) by mouth 3 (three) times daily before meals. (Patient not taking: Reported on 01/13/2014) 90 capsule 3  . oxyCODONE-acetaminophen (PERCOCET/ROXICET) 5-325 MG per tablet Take 1-2 tablets by mouth every 4 (four) hours as needed for severe pain. (Patient not taking: Reported on 01/13/2014) 30 tablet 0  . Prenatal Multivit-Min-Fe-FA (PRENATAL VITAMINS PO) Take 1 each by mouth at bedtime.      No facility-administered medications prior to visit.    PE: Blood pressure 132/82, pulse 85, temperature 98.4 F (36.9 C), temperature source Temporal, resp. rate 18, height 5' 0.5" (1.537 m), weight 113 lb (51.256 kg), SpO2 100 %, not currently breastfeeding. VS: noted--normal. Gen:  alert, NAD, NONTOXIC APPEARING. HEENT: eyes without injection, drainage, or swelling.  Ears: EACs clear, TMs with normal light reflex and landmarks.  Nose: some dried, crusty exudate adherent to mildly injected mucosa.  No purulent d/c.  Diffuse mild paranasal sinus TTP.  No facial swelling.  Throat and mouth without focal lesion.  No pharyngial swelling, erythema, or exudate.   Neck: supple, no LAD.   LUNGS: CTA bilat, nonlabored resps.   CV: RRR, no m/r/g. EXT: no c/c/e SKIN: no rash   IMPRESSION AND PLAN:  1) Acute sinusitis; azithromycin. Saline nasal spray for nasal passage irrigation. Restart zyrtec 10mg  qd. Mucinex DM or robitussin DM prn.  2) Left De Quervaine's tenosynovitis. Thumb spica splint fitted/dispensed in office today. Wear for 1 mo and if no better then she'll return and I'll offer steroid injection.  An After Visit Summary was printed and given to the patient.  FOLLOW UP: prn

## 2014-01-13 NOTE — Progress Notes (Signed)
Pre visit review using our clinic review tool, if applicable. No additional management support is needed unless otherwise documented below in the visit note. 

## 2014-03-22 IMAGING — CR DG HAND COMPLETE 3+V*R*
3 series · 3 of 3 positions shown · non-contrast
Comparison: None.

CLINICAL DATA: Pain.

EXAM:
RIGHT HAND - COMPLETE 3+ VIEW

[view not recorded (1 of 3)]
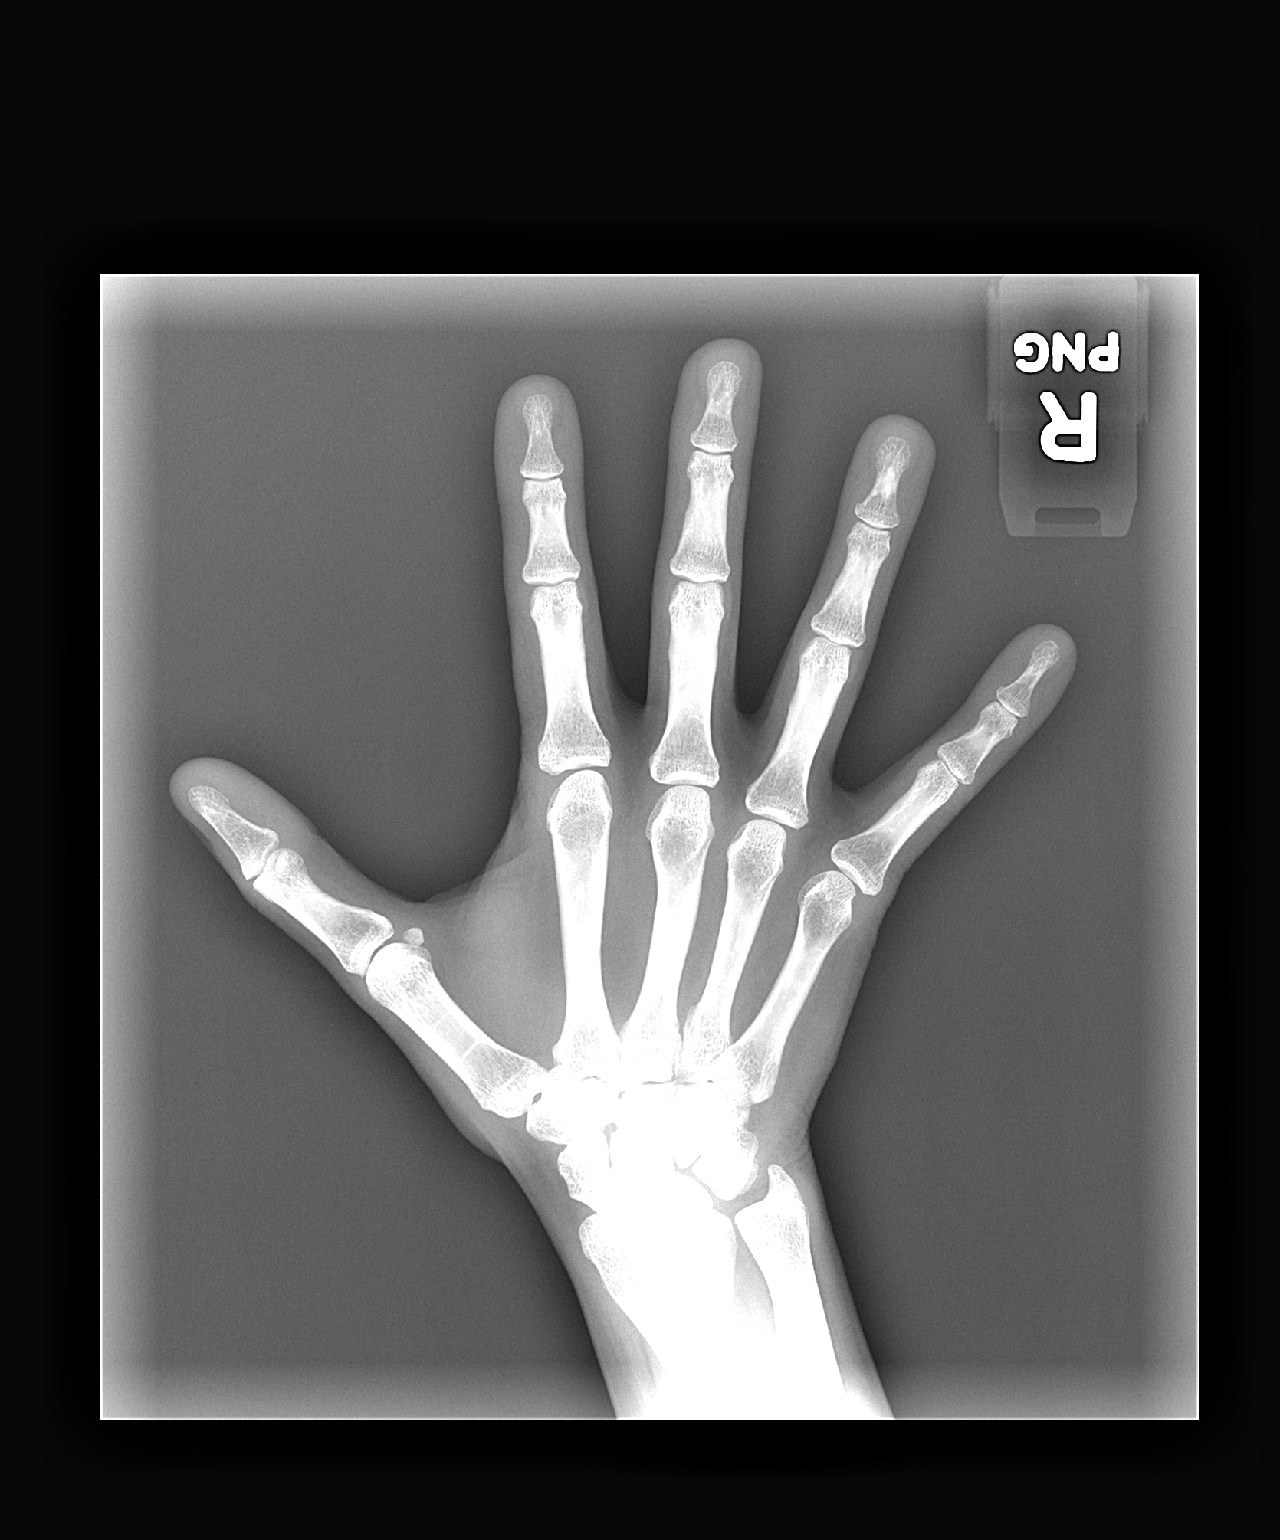

[view not recorded (2 of 3)]
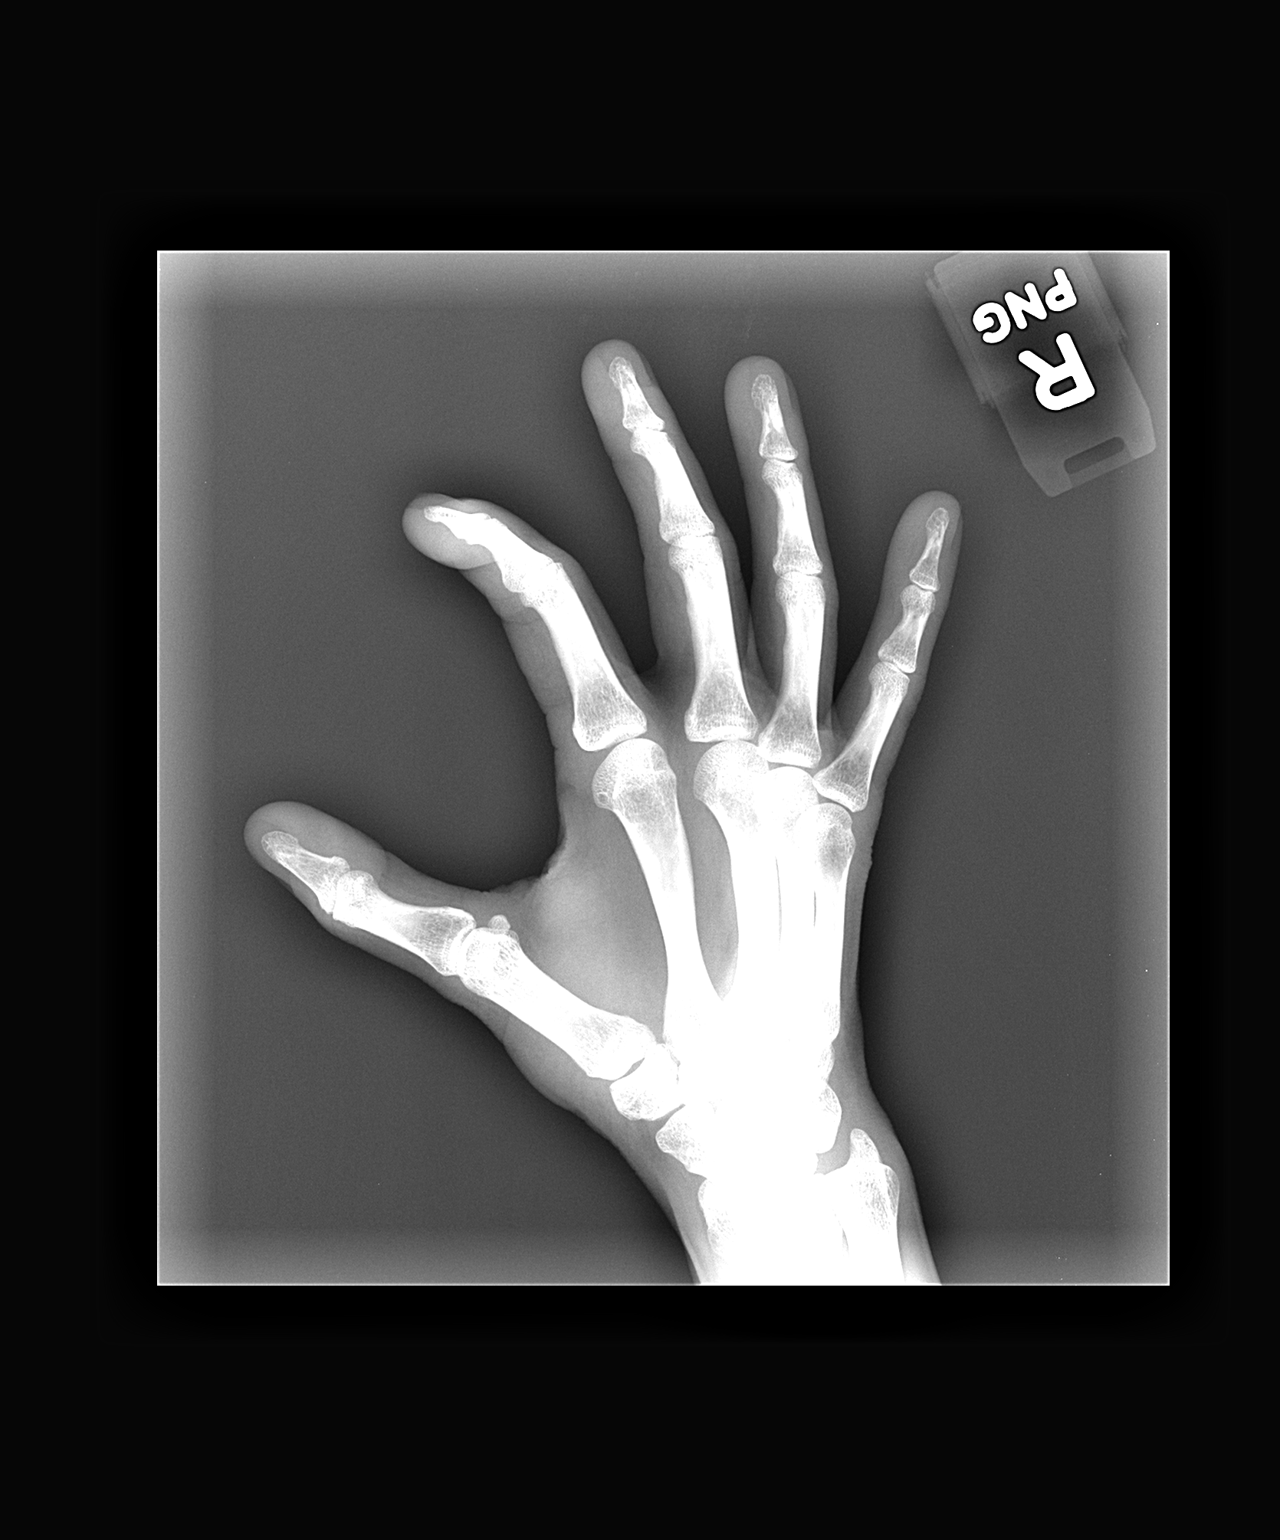

[view not recorded (3 of 3)]
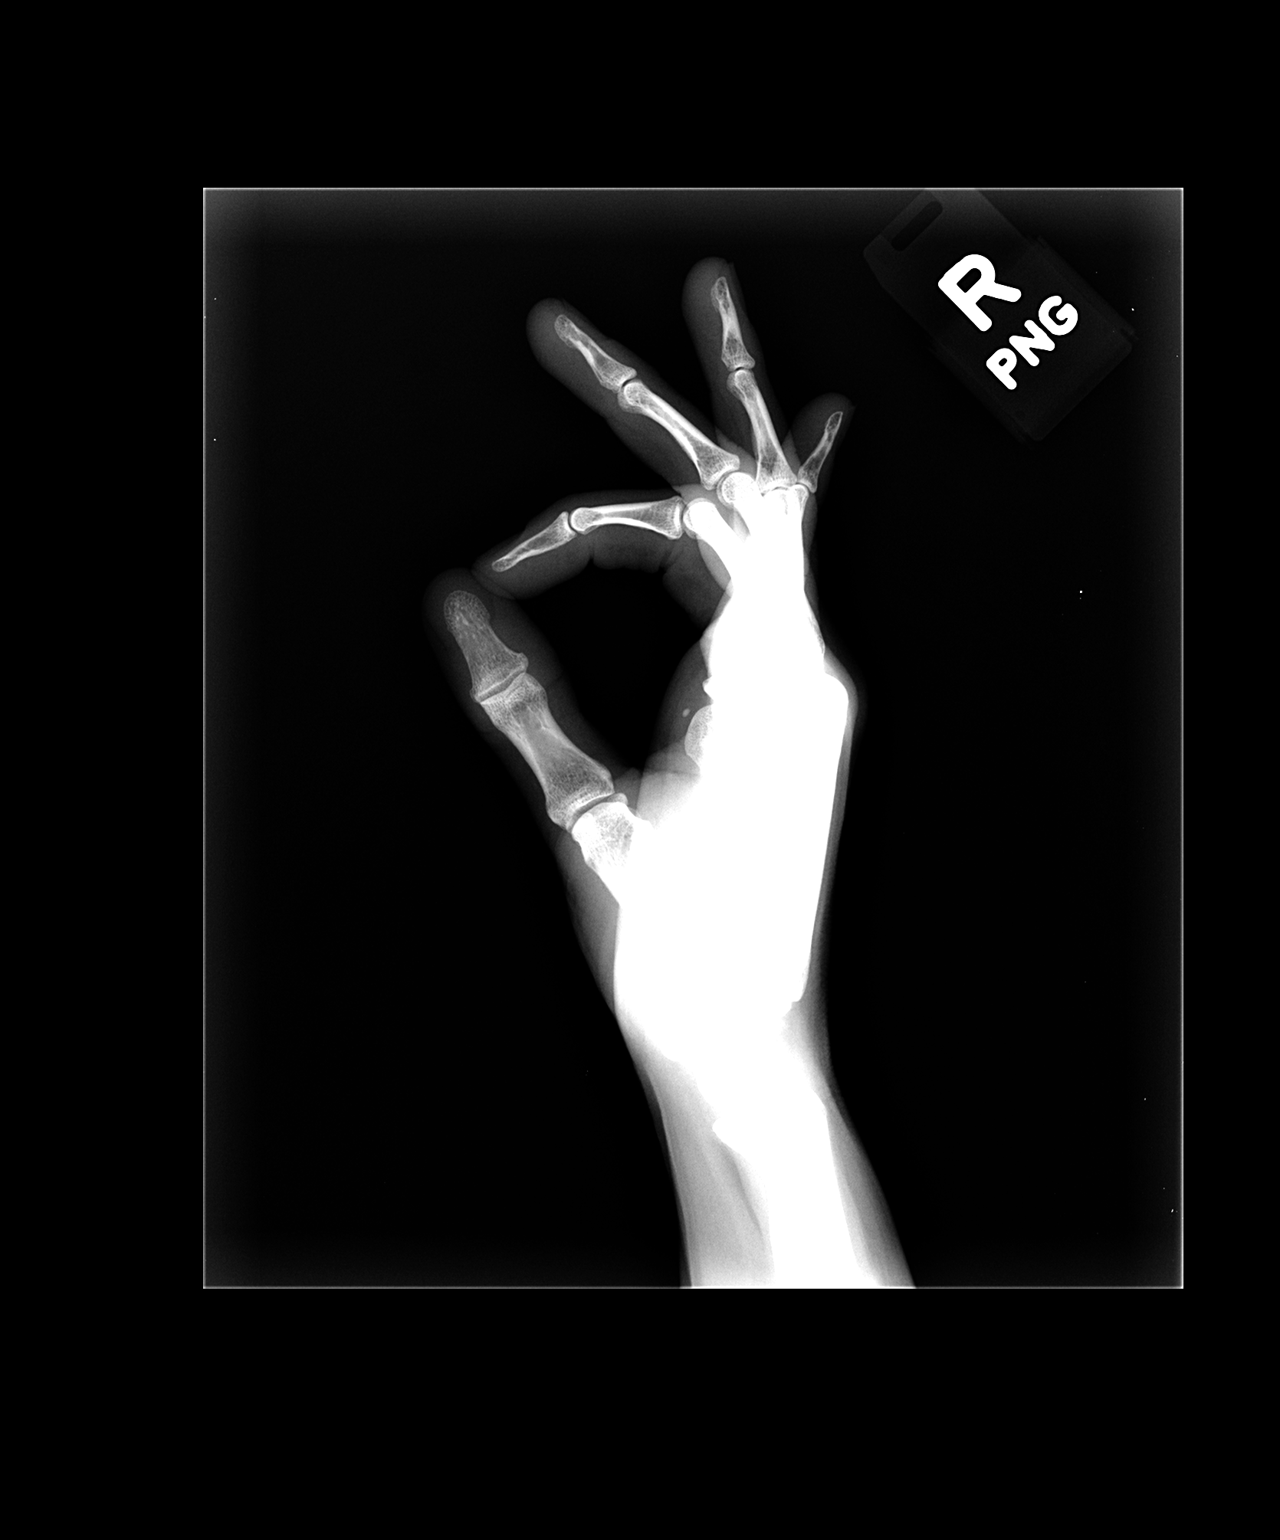

[3 of 3 positions shown; findings below may reference images not displayed]

FINDINGS: There is no evidence of fracture or dislocation. There is no
evidence of arthropathy or other focal bone abnormality. Soft
tissues are unremarkable.
IMPRESSION: Negative.

## 2014-10-14 ENCOUNTER — Telehealth: Payer: Self-pay | Admitting: Family Medicine

## 2014-10-14 NOTE — Telephone Encounter (Signed)
Pt called and is requesting a rx for But-atap-caf 50/300/40. She is currently getting this RX from her OB. Her OB instructed her to start seeing Fam Med Dr. For her migraines and have him write and follow her. She is scheduled to see Dr. Milinda Cave On Nov 1. Please RX into her new [facility]: [facility] at [address] [place] [place], [place] [address]. Phone number [phone].   She has moved but will continue to have Dr. Milinda Cave as her PCP because she loves him!!!

## 2014-10-15 MED ORDER — BUTALBITAL-APAP-CAFFEINE 50-300-40 MG PO CAPS: ORAL_CAPSULE | ORAL | Status: DC

## 2014-10-15 NOTE — Telephone Encounter (Signed)
Rx for But-apap-caf 50/300/40 sent to her pharmacy as per her request.

## 2014-10-15 NOTE — Telephone Encounter (Signed)
Please advise. Thanks.  

## 2014-10-16 NOTE — Telephone Encounter (Signed)
Left detailed message on home vm, okay per DPR.  

## 2014-11-03 ENCOUNTER — Encounter: Payer: Self-pay | Admitting: Family Medicine

## 2014-11-04 ENCOUNTER — Ambulatory Visit (INDEPENDENT_AMBULATORY_CARE_PROVIDER_SITE_OTHER): Payer: 59 | Admitting: Family Medicine

## 2014-11-04 ENCOUNTER — Encounter: Payer: Self-pay | Admitting: Family Medicine

## 2014-11-04 VITALS — BP 107/72 | HR 111 | Temp 99.0°F | Resp 16 | Ht 60.5 in | Wt 109.0 lb

## 2014-11-04 DIAGNOSIS — G43109 Migraine with aura, not intractable, without status migrainosus: Secondary | ICD-10-CM

## 2014-11-04 DIAGNOSIS — G43909 Migraine, unspecified, not intractable, without status migrainosus: Secondary | ICD-10-CM

## 2014-11-04 MED ORDER — ELETRIPTAN HYDROBROMIDE 40 MG PO TABS: 40.0000 mg | ORAL_TABLET | ORAL | Status: DC | PRN

## 2014-11-04 MED ORDER — AMITRIPTYLINE HCL 25 MG PO TABS: ORAL_TABLET | ORAL | Status: DC

## 2014-11-04 NOTE — Patient Instructions (Signed)
Take 1 fioricet tab every 3 days until you are completely out of this med.  Take amitriptyline 25mg  tab every evening before bed x 10-14d, then increase to 2 tabs every evening before bed.

## 2014-11-04 NOTE — Progress Notes (Signed)
OFFICE VISIT  11/04/2014   CC:  Chief Complaint  Patient presents with  . Follow-up    Migraine HA    HPI:    Patient is a 49 y.o. Caucasian female who presents for f/u of migraine HA's.  She is a bit hard to follow with her history, talks very fast, but essentially: Says has had HA's since around age 49, has been told by specialists she is "just a tough case", last specialist was Dr. Vela ProseLewitt about 7 yrs ago.  Usual frequency over last couple years has been 2-3 per month. However, having daily HA's last couple weeks. Allergies worse lately so addition of allegra has helped some. Reports migraines didn't get any better with various preventative treatments in the past, but she can't recall a lot of specifics. Meds not helped in past: maxalt MLT, imitrex--not in 7 yrs. Topamax caused too much wt loss.  Propranolol: dropped bp too low. Amitriptyline in past helped (she thinks) but this was a long time ago. She doesn't think she has tried some of the "newer" triptans. Lately, with more frequent HA's she has been using her fioricet daily and is likely starting to get some med overuse component to her HA's so she wants to switch to different med.  Typical HA: No aura, starts with quick pounding/throbbing in temples and forehead and some in back of head, lasts a day or so usually, sometimes hangs around for a few days.  Says she has seen multiple HA specialists in the past.  Past Medical History  Diagnosis Date  . Depression   . Bipolar disorder (HCC)   . Migraine   . Anemia remote past    nutritional; slow Fe  . Fainting spell   . Frequent headaches   . Hypothyroidism   . Gestational diabetes mellitus, antepartum     Past Surgical History  Procedure Laterality Date  . None    . No past surgeries      Outpatient Prescriptions Prior to Visit  Medication Sig Dispense Refill  . Butalbital-APAP-Caffeine 50-300-40 MG CAPS 1-2 tabs po qd prn headache 40 capsule 0  . cetirizine  (ZYRTEC) 10 MG tablet Take 1 tablet (10 mg total) by mouth daily. 30 tablet 11  . norgestimate-ethinyl estradiol (ORTHO-CYCLEN,SPRINTEC,PREVIFEM) 0.25-35 MG-MCG tablet Take 1 tablet by mouth daily.    Marland Kitchen. venlafaxine XR (EFFEXOR-XR) 75 MG 24 hr capsule Take 75 mg by mouth daily with breakfast.    . azithromycin (ZITHROMAX) 250 MG tablet 2 tabs po qd x 1d, then 1 tab po qd x 4d (Patient not taking: Reported on 11/04/2014) 6 tablet 0  . calcium carbonate (TUMS - DOSED IN MG ELEMENTAL CALCIUM) 500 MG chewable tablet Chew 2 tablets by mouth daily as needed for indigestion or heartburn (nausea).    . docusate sodium (COLACE) 100 MG capsule Take 1 capsule (100 mg total) by mouth 3 (three) times daily before meals. (Patient not taking: Reported on 01/13/2014) 90 capsule 3  . Prenatal Multivit-Min-Fe-FA (PRENATAL VITAMINS PO) Take 1 each by mouth at bedtime.      No facility-administered medications prior to visit.    Allergies  Allergen Reactions  . Droperidol     extraparamodol  . Ketorolac Tromethamine     extraparamadol  . Codeine Nausea And Vomiting, Rash and Other (See Comments)    Extra premidol reaction    ROS As per HPI  PE: Blood pressure 107/72, pulse 111, temperature 99 F (37.2 C), temperature source Oral, resp. rate 16,  height 5' 0.5" (1.537 m), weight 109 lb (49.442 kg), SpO2 93 %, not currently breastfeeding. Wt Readings from Last 2 Encounters:  11/04/14 109 lb (49.442 kg)  01/13/14 113 lb (51.256 kg)    Gen: alert, oriented x 4, affect pleasant.  Lucid thinking and conversation noted. HEENT: PERRLA, EOMI.   Neck: no LAD, mass, or thyromegaly. CV: RRR, no m/r/g LUNGS: CTA bilat, nonlabored. NEURO: no tremor or tics noted on observation.  Coordination intact. CN 2-12 grossly intact bilaterally, strength 5/5 in all extremeties.  No ataxia.   LABS:  none  IMPRESSION AND PLAN:  Migraine HA syndrome, acute worsening lately possibly triggered by worsened seasonal allergies  + complicated by overuse of fioricet. Plan: continue allegra daily. Take 1 fioricet tab every 3 days until you are completely out of this med.  Patient was in agreement with avoiding this med b/c of the complications it can cause with use for HA's (overuse HA syndrome).  Take amitriptyline  tab every evening before bed x 10-14d, then increase to 2 tabs every evening before bed. Eletriptan  at time of HA for abortive treatment. Therapeutic expectations and side effect profile of medication discussed today.  Patient's questions answered.  Pt recently relocated to [facility], [place] but she says she will still be able to f/u with me for her med issues as of this time.  An After Visit Summary was printed and given to the patient.  FOLLOW UP: Return in about 2 months (around 01/04/2015) for f/u migraine HA's.

## 2014-11-04 NOTE — Progress Notes (Signed)
Pre visit review using our clinic review tool, if applicable. No additional management support is needed unless otherwise documented below in the visit note. 

## 2015-02-26 ENCOUNTER — Telehealth: Payer: Self-pay | Admitting: Family Medicine

## 2015-02-26 NOTE — Telephone Encounter (Signed)
Patient states her insurance does not cover Rx for migraines very well. Is there a different medication she can try? She would like to pick up a hard copy on Monday or Tuesday of next week as that she is in the process of moving and will be close to our office those days.

## 2015-02-27 MED ORDER — AMITRIPTYLINE HCL 25 MG PO TABS: ORAL_TABLET | ORAL | Status: DC

## 2015-02-27 NOTE — Telephone Encounter (Signed)
Only meds like the one she is rx'd are used (she can check with her insurer about sumatriptan, rizatriptan, and frovatriptan).

## 2015-02-27 NOTE — Telephone Encounter (Signed)
Please advise. Thanks.  

## 2015-02-27 NOTE — Telephone Encounter (Signed)
Left message for pt to call back  °

## 2015-02-27 NOTE — Telephone Encounter (Signed)
Pt advised and voiced understanding. She was a little short on the phone because I was unable to answer detailed questions about the medication below. She stated that she would call her insurance and see which of these medications they recommend. She stated that she is moving to [facility] on 03/14/15 and would like to get a Rx for her amitriptyline. Rx has been sent. Waiting of pt to call back with information about the medications below.

## 2015-03-10 NOTE — Telephone Encounter (Signed)
Forwarding

## 2015-03-17 ENCOUNTER — Other Ambulatory Visit (HOSPITAL_COMMUNITY): Payer: Self-pay | Admitting: Psychiatry

## 2015-03-17 MED ORDER — RIZATRIPTAN BENZOATE 10 MG PO TABS: 10.0000 mg | ORAL_TABLET | Freq: Every day | ORAL | Status: DC | PRN

## 2015-03-17 NOTE — Telephone Encounter (Signed)
Pt called back, Ins will cover Rx Rizatriptan and Srovatriptan, [facility] [place]. Please call pt to let her know which Rx was called in.

## 2015-03-17 NOTE — Telephone Encounter (Signed)
Rizatriptan or Frovatriptan. Please advise. Thanks.

## 2015-03-17 NOTE — Telephone Encounter (Signed)
Please send in a prescription for rizatriptan 10 mg, one tab PO Q day PRN headache. May repeat dose in two hours if needed. Maximum dose in 24 hours is 30 mg. Dispense 10 tabs, refill times two.

## 2015-03-17 NOTE — Telephone Encounter (Signed)
Rx sent as directed below. Pt advised and voiced understanding.

## 2015-03-17 NOTE — Telephone Encounter (Signed)
Pt has called back and asking for a call to discuss meds. Pt states that if she does not hear anything this afternoon that she would come to the office today.

## 2015-03-17 NOTE — Telephone Encounter (Signed)
Spoke to pt again. She was again very short and snappy on the phone. She stated that she needs this resolved "TODAY". She stated that she does not live in [place] anymore and is here only for today. She wanted to know what type of migraine medications were these, per Dr. Milinda CaveMcGowen the medications mentioned below are in the same class as what she was taking before they are triptans and they work the same way. Pt advised and voiced understanding. She stated that she will call her insurance company and find out which medication they will cover and will call us back to let us know. She stated [facility] [place].

## 2015-04-08 ENCOUNTER — Other Ambulatory Visit (HOSPITAL_COMMUNITY): Payer: Self-pay | Admitting: Psychiatry

## 2015-11-07 ENCOUNTER — Other Ambulatory Visit: Payer: Self-pay | Admitting: Family Medicine

## 2015-11-09 ENCOUNTER — Telehealth: Payer: Self-pay

## 2015-11-09 NOTE — Telephone Encounter (Signed)
Refused medication as it has been over 1 year since patient has been seen and she was supposed to f/u within 2 months of 11/04/14 visit about this medication.   Did leave message as noted below.

## 2015-11-09 NOTE — Telephone Encounter (Signed)
LMOM for pt to CB to see if she is still an active patient since the last telephone note states that she was moving to [facility].  Awaiting CB.

## 2015-11-09 NOTE — Telephone Encounter (Signed)
Patient called requesting refill on Amitriptyline, Patient has not been seen in over a year and has [place] out of state. Patient notified that we would not be able to fill this without her being seen per Dr. Milinda CaveMcGowen.

## 2021-03-11 ENCOUNTER — Encounter: Payer: Self-pay | Admitting: Neurology

## 2021-03-16 ENCOUNTER — Telehealth: Payer: Self-pay | Admitting: Family

## 2021-03-16 ENCOUNTER — Ambulatory Visit: Payer: 59 | Admitting: Family

## 2021-03-16 ENCOUNTER — Encounter: Payer: Self-pay | Admitting: Family

## 2021-03-16 ENCOUNTER — Other Ambulatory Visit: Payer: Self-pay | Admitting: Family

## 2021-03-16 VITALS — BP 118/83 | HR 89 | Temp 98.1°F | Ht 60.0 in | Wt 108.6 lb

## 2021-03-16 DIAGNOSIS — G43109 Migraine with aura, not intractable, without status migrainosus: Secondary | ICD-10-CM | POA: Insufficient documentation

## 2021-03-16 DIAGNOSIS — F32A Depression, unspecified: Secondary | ICD-10-CM

## 2021-03-16 DIAGNOSIS — F419 Anxiety disorder, unspecified: Secondary | ICD-10-CM | POA: Diagnosis not present

## 2021-03-16 MED ORDER — DULOXETINE HCL 60 MG PO CPEP: 60.0000 mg | ORAL_CAPSULE | Freq: Every day | ORAL | 1 refills | Status: DC

## 2021-03-16 MED ORDER — UBRELVY 100 MG PO TABS: 100.0000 mg | ORAL_TABLET | Freq: Every day | ORAL | 5 refills | Status: DC | PRN

## 2021-03-16 NOTE — Telephone Encounter (Signed)
Per patient: Pharmacist sent over info re prior auth for Ubrogepant (UBRELVY) 100 MG TABS ?

## 2021-03-16 NOTE — Assessment & Plan Note (Signed)
Chronic - pt reports having since her teens, has tried & failed many meds,currently using Topamax and [name], has 1st appt w/NEURO in June, needing refill till then, ?

## 2021-03-16 NOTE — Patient Instructions (Signed)
Welcome to [facility]! It was a pleasure meeting you today. ? ?As discussed, I have sent your referrals to your pharmacy. ? ?Please schedule a physical with fasting labs at your convenience today. ? ? ? ?PLEASE NOTE: ? ?If you had any LAB tests please let us know if you have not heard back within a few days. You may see your results on MyChart before we have a chance to review them but we will give you a call once they are reviewed by [place]. If we ordered any REFERRALS today, please let us know if you have not heard from their office within the next week.  ?Let us know through MyChart if you are needing REFILLS, or have your pharmacy send [place] the request. You can also use MyChart to communicate with me or any office staff. ? ?Please try these tips to maintain a healthy lifestyle: ? ?Eat most of your calories during the day when you are active. Eliminate processed foods including packaged sweets (pies, cakes, cookies), reduce intake of potatoes, white bread, white pasta, and white rice. Look for whole grain options, oat flour or almond flour. ? ?Each meal should contain half fruits/vegetables, one quarter protein, and one quarter carbs (no bigger than a computer mouse). ? ?Cut down on sweet beverages. This includes juice, soda, and sweet tea. Also watch fruit intake, though this is a healthier sweet option, it still contains natural sugar! Limit to 3 servings daily. ? ?Drink at least 1 glass of water with each meal and aim for at least 8 glasses per day ? ?Exercise at least 150 minutes every week.  ? ?

## 2021-03-16 NOTE — Assessment & Plan Note (Signed)
Chronic - pt currently taking Cymbalta 60mg  bid, but would like to try qd and see how she does, sending refill. f/u in 99mos. ?

## 2021-03-16 NOTE — Progress Notes (Signed)
? ?New Patient Office Visit ? ?Subjective:  ?Patient ID: Alyssa [name], female    DOB: [birth year]  Age: 56 y.o. MRN: [id number] ? ?CC:  ?Chief Complaint  ?Patient presents with  ? Establish Care  ?  Pt is needing a refill on Duloxetine and Ubrelvy bc she can not see Neuro 07/02/2021  ? ?HPI ?Alyssa [name] presents for establishing care and to discuss a couple of problems. ?Headache: Patient complains of headache. She does not have a headache at this time.  ? Character of pain:pulsating, sharp, stabbing, and throbbing. Accompanying symptoms: photopsia Prodromal sx?: sometimes feels tingling. Rapidity of onset: gradual. Typical duration of individual headache: a few hours. Are most headaches similar in presentation? yes Typical precipitants: stress, odors, allergens, certain foods. Started having HAs  years ago. Worst time of day: random. ?Awaken from sleep?: yes - but takes Elavil to prevent this. Seasonal pattern?: yes - can be more often. Abortive meds? [name]. Daily use? no. Prophylactic meds? Topamax. ?History of head/neck trauma? no. Use of meds that might worsen HAs? no. Exposure to carbon monoxide? no. Caffeine use: minimal.  ?Anxiety/Depression: Patient complains of  anxiety & depression .   ?She has the following symptoms: none.  ?Onset of symptoms was approximately  years ago, She denies current suicidal and homicidal ideation.  ?Possible organic causes contributing are: none.  ?Risk factors: previous episode of depression  ?Previous treatment includes  Cymbalta  and individual therapy.  She complains of the following side effects from the treatment: none. ? ?Past Medical History:  ?Diagnosis Date  ? Abnormal weight loss 10/10/2012  ? 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 Anemia remote past  ? nutritional; slow Fe  ? Bipolar disorder (HCC)   ? Depression   ? Fainting spell   ? Frequent headaches   ? Gestational diabetes mellitus, antepartum   ? Hypothyroidism   ? Migraine syndrome   ?  Mood disorder (HCC) 10/10/2012  ? Normal labor 08/10/2013  ? Status post vacuum-assisted vaginal delivery 08/11/2013  ? Syncope 10/10/2012  ? ? ?Past Surgical History:  ?Procedure Laterality Date  ? NO PAST SURGERIES    ? none    ? ? ?Family History  ?Problem Relation Age of Onset  ? Mental retardation Neg Hx   ? Diabetes Neg Hx   ? Heart disease Neg Hx   ? Cancer Sister   ? Stroke Maternal Grandmother   ? Alcohol abuse Paternal Grandfather   ? ? ?Social History  ? ?Socioeconomic History  ? Marital status: Married  ?  Spouse name: Not on file  ? Number of children: Not on file  ? Years of education: Not on file  ? Highest education level: Not on file  ?Occupational History  ? Not on file  ?Tobacco Use  ? Smoking status: Never  ? Smokeless tobacco: Never  ?Substance and Sexual Activity  ? Alcohol use: No  ? Drug use: No  ? Sexual activity: Yes  ?Other Topics Concern  ? Not on file  ?Social History Narrative  ? Married, one child.    ? 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  ? HS grad--[employer].  ? Hobbies/interests: reading, her dog, church, visit with family.  ? No T/A/Ds.  ? ?Social Determinants of Health  ? ?Financial Resource Strain: Not on file  ?Food Insecurity: Not on file  ?Transportation Needs: Not on file  ?Physical Activity: Not on file  ?Stress: Not on file  ?Social Connections: Not on file  ?Intimate Partner  Violence: Not on file  ? ? ?Objective:  ? ?Today's Vitals: BP 118/83   Pulse 89   Temp 98.1 ?F (36.7 ?C)   Ht 5' (1.524 m)   Wt 108 lb 9.6 oz (49.3 kg)   SpO2 99%   BMI 21.21 kg/m?  ? ?Physical Exam ?Vitals and nursing note reviewed.  ?Constitutional:   ?   Appearance: Normal appearance.  ?Cardiovascular:  ?   Rate and Rhythm: Normal rate and regular rhythm.  ?Pulmonary:  ?   Effort: Pulmonary effort is normal.  ?   Breath sounds: Normal breath sounds.  ?Musculoskeletal:     ?   General: Normal range of motion.  ?Skin: ?   General: Skin is warm and dry.  ?Neurological:  ?   Mental Status: She is alert.   ?Psychiatric:     ?   Mood and Affect: Mood normal.     ?   Behavior: Behavior normal.  ? ? ?Assessment & Plan:  ? ?Problem List Items Addressed This Visit   ? ?  ? Cardiovascular and Mediastinum  ? Migraine with aura and without status migrainosus, not intractable  ?  Chronic - pt reports having since her teens, has tried & failed many meds,currently using Topamax and [name], has 1st appt w/NEURO in June, needing refill till then, ?  ?  ? Relevant Medications  ? topiramate (TOPAMAX) 50 MG tablet  ? DULoxetine (CYMBALTA) 60 MG capsule  ? Ubrogepant (UBRELVY) 100 MG TABS  ?  ? Other  ? Anxiety and depression - Primary  ?  Chronic - pt currently taking Cymbalta 60mg  bid, but would like to try qd and see how she does, sending refill. f/u in 67mos. ?  ?  ? Relevant Medications  ? DULoxetine (CYMBALTA) 60 MG capsule  ? ? ?Outpatient Encounter Medications as of 03/16/2021  ?Medication Sig  ? amitriptyline (ELAVIL) 25 MG tablet 1-2 tabs po qhs for migraine prevention  ? cetirizine (ZYRTEC) 10 MG tablet Take 1 tablet (10 mg total) by mouth daily.  ? topiramate (TOPAMAX) 50 MG tablet 1 tablet 2 (two) times daily.  ? Ubrogepant (UBRELVY) 100 MG TABS Take 100 mg by mouth daily as needed.  ? [DISCONTINUED] amitriptyline (ELAVIL) 50 MG tablet Take 1 tablet by mouth at bedtime.  ? [DISCONTINUED] DULoxetine (CYMBALTA) 60 MG capsule Take by mouth.  ? [DISCONTINUED] venlafaxine XR (EFFEXOR-XR) 75 MG 24 hr capsule Take 75 mg by mouth daily with breakfast.  ? DULoxetine (CYMBALTA) 60 MG capsule Take 1 capsule (60 mg total) by mouth daily.  ? [DISCONTINUED] eletriptan (RELPAX) 40 MG tablet Take 1 tablet (40 mg total) by mouth as needed for migraine or headache. May repeat in 2 hours if headache persists or recurs. (Patient not taking: Reported on 03/16/2021)  ? [DISCONTINUED] norgestimate-ethinyl estradiol (ORTHO-CYCLEN,SPRINTEC,PREVIFEM) 0.25-35 MG-MCG tablet Take 1 tablet by mouth daily. (Patient not taking: Reported on 03/16/2021)  ?  [DISCONTINUED] rizatriptan (MAXALT) 10 MG tablet Take 1 tablet (10 mg total) by mouth daily as needed for migraine. May repeat in 2 hours if needed. Max dose in 24 hours is 30mg . (Patient not taking: Reported on 03/16/2021)  ? ?No facility-administered encounter medications on file as of 03/16/2021.  ? ? ?Follow-up: Return for Complete physical w/fasting labs.  ? ?03/18/2021, NP ?

## 2021-03-19 NOTE — Telephone Encounter (Signed)
PA started, Will be contacting patient for Insurance verification.  ?

## 2021-06-08 ENCOUNTER — Encounter: Payer: Self-pay | Admitting: Family

## 2021-06-08 ENCOUNTER — Ambulatory Visit: Payer: 59 | Admitting: Family

## 2021-06-08 VITALS — BP 105/66 | HR 90 | Temp 98.3°F | Ht 60.0 in | Wt 111.5 lb

## 2021-06-08 DIAGNOSIS — F419 Anxiety disorder, unspecified: Secondary | ICD-10-CM | POA: Diagnosis not present

## 2021-06-08 DIAGNOSIS — F32A Depression, unspecified: Secondary | ICD-10-CM | POA: Diagnosis not present

## 2021-06-08 DIAGNOSIS — G43109 Migraine with aura, not intractable, without status migrainosus: Secondary | ICD-10-CM

## 2021-06-08 MED ORDER — BUPROPION HCL ER (SR) 150 MG PO TB12: 150.0000 mg | ORAL_TABLET | Freq: Two times a day (BID) | ORAL | 2 refills | Status: DC

## 2021-06-08 MED ORDER — SUMATRIPTAN SUCCINATE 100 MG PO TABS: 100.0000 mg | ORAL_TABLET | ORAL | 0 refills | Status: DC

## 2021-06-08 MED ORDER — SUMATRIPTAN SUCCINATE 100 MG PO TABS: 100.0000 mg | ORAL_TABLET | ORAL | 0 refills | Status: DC | PRN

## 2021-06-08 NOTE — Progress Notes (Signed)
Subjective:     Patient ID: Alyssa [name], female    DOB: 10/29/1965, 56 y.o.   MRN: [id number]  Chief Complaint  Patient presents with   Follow-up    Discuss Duloxetine, Pt states it is not working and would like to switch. And discuss ubrevly.    HPI: Headache: Patient complains of headache. She does not have a headache at this time.   Character of pain:pulsating, sharp, stabbing, and throbbing. Accompanying symptoms: photopsia Prodromal sx?: sometimes feels tingling. Rapidity of onset: gradual. Typical duration of individual headache: a few hours. Are most headaches similar in presentation? yes Typical precipitants: stress, odors, allergens, certain foods. Started having HAs  years ago. Worst time of day: random. Awaken from sleep?: yes - but takes Elavil to prevent this. Seasonal pattern?: yes - can be more often. Abortive meds? Roselyn Meier. Daily use? no. Prophylactic meds? Topamax. History of head/neck trauma? no. Use of meds that might worsen HAs? no. Exposure to carbon monoxide? no. Caffeine use: minimal.  Anxiety/Depression: Patient complains of anxiety & depression.   She has the following symptoms: none.  Onset of symptoms was approximately  years ago, She denies current suicidal and homicidal ideation.  Possible organic causes contributing are: none.  Risk factors: previous episode of depression  Previous treatment includes Cymbalta and individual therapy.  She complains of the following side effects from the treatment: no longer working. Used to be on Effexor for many years and had to get off.   Assessment & Plan:   Problem List Items Addressed This Visit       Cardiovascular and Mediastinum   Migraine with aura and without status migrainosus, not intractable - Primary    Chronic - current regimen is Elavil, Topamax, Ulbrelvy, but pt has been unable to get [place] from pharmacy, reports last provider never able to get, we have started a PA. She has appt with Neuro end of  month. Will reach out and see if pt can pick up any samples before then. Sending Imitrex to at least have on hand. Pt having a lot of stress triggers d/t cg for her mother, aunt, and disabled son.       Relevant Medications   butalbital-acetaminophen-caffeine (FIORICET) 50-325-40 MG tablet   buPROPion (WELLBUTRIN SR) 150 MG 12 hr tablet   SUMAtriptan (IMITREX) 100 MG tablet     Other   Anxiety and depression    Chronic - pt does not want to take the Cymbalta any longer, no longer working. Reports hx of Effexor, but had to stop. Does not want weight gain. Discussed options, does not remember trying Wellbutrin, sending 12h 150mg , advised ok to start tomorrow and stop Cymbalta. Advised on use & SE, f/u in 3 months or sooner if dose adjustment needed.       Relevant Medications   buPROPion (WELLBUTRIN SR) 150 MG 12 hr tablet    Outpatient Medications Prior to Visit  Medication Sig Dispense Refill   amitriptyline (ELAVIL) 25 MG tablet 1-2 tabs po qhs for migraine prevention 60 tablet 3   butalbital-acetaminophen-caffeine (FIORICET) 50-325-40 MG tablet Take 1 tablet by mouth every 6 (six) hours as needed.     cetirizine (ZYRTEC) 10 MG tablet Take 1 tablet (10 mg total) by mouth daily. 30 tablet 11   topiramate (TOPAMAX) 50 MG tablet 1 tablet 2 (two) times daily.     UBRELVY 100 MG TABS TAKE 1 TABLET BY MOUTH EVERY DAY AS NEEDED 30 tablet 5   DULoxetine (CYMBALTA) 60  MG capsule Take 1 capsule (60 mg total) by mouth daily. 90 capsule 1   No facility-administered medications prior to visit.    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   Anemia remote past   nutritional; slow Fe   Bipolar disorder ([place])    Depression    Fainting spell    Frequent headaches    Gestational diabetes mellitus, antepartum    Hypothyroidism    Migraine syndrome    Mood disorder ([place]) 10/10/2012   Normal labor 08/10/2013    Status post vacuum-assisted vaginal delivery 08/11/2013   Syncope 10/10/2012    Past Surgical History:  Procedure Laterality Date   NO PAST SURGERIES     none      Allergies  Allergen Reactions   Droperidol     extraparamodol   Ketorolac Tromethamine     extraparamadol   Codeine Nausea And Vomiting, Rash and Other (See Comments)    Extra premidol reaction       Objective:    Physical Exam Vitals and nursing note reviewed.  Constitutional:      Appearance: Normal appearance.  Cardiovascular:     Rate and Rhythm: Normal rate and regular rhythm.  Pulmonary:     Effort: Pulmonary effort is normal.     Breath sounds: Normal breath sounds.  Musculoskeletal:        General: Normal range of motion.  Skin:    General: Skin is warm and dry.  Neurological:     Mental Status: She is alert.  Psychiatric:        Mood and Affect: Mood normal.        Behavior: Behavior normal.    BP 105/66 (BP Location: Left Arm, Patient Position: Sitting, Cuff Size: Large)   Pulse 90   Temp 98.3 F (36.8 C) (Temporal)   Ht 5' (1.524 m)   Wt 111 lb 8 oz (50.6 kg)   SpO2 100%   BMI 21.78 kg/m  Wt Readings from Last 3 Encounters:  06/08/21 111 lb 8 oz (50.6 kg)  03/16/21 108 lb 9.6 oz (49.3 kg)  11/04/14 109 lb (49.4 kg)        Meds ordered this encounter  Medications   buPROPion (WELLBUTRIN SR) 150 MG 12 hr tablet    Sig: Take 1 tablet (150 mg total) by mouth 2 (two) times daily.    Dispense:  60 tablet    Refill:  2    D/C Cymbalta    Order Specific Question:   Supervising Provider    Answer:   ANDY, CAMILLE L [id number]   DISCONTD: SUMAtriptan (IMITREX) 100 MG tablet    Sig: Take 1 tablet (100 mg total) by mouth every 2 (two) hours as needed for migraine. May repeat in 2 hours if headache persists or recurs.    Dispense:  20 tablet    Refill:  0    Order Specific Question:   Supervising Provider    Answer:   ANDY, CAMILLE L [[id number]]   SUMAtriptan (IMITREX) 100 MG tablet    Sig:  Take 1 tablet (100 mg total) by mouth as directed. May repeat in 2 hours if headache persists or recurs.    Dispense:  20 tablet    Refill:  0    Order Specific Question:   Supervising Provider    Answer:   ANDY, CAMILLE L [id number]    Jeanie Sewer, NP

## 2021-06-08 NOTE — Patient Instructions (Addendum)
It was very nice to see you today!   I have sent over generic Wellbutrin to start tomorrow. Start with once a day, in the morning, for a few days, if tolerated, increase to twice a day.  Should feel the effects within a few days.  Let me know if you want to increase the dose after a couple of week, if needed.  I will message your Neurology provider to see if they have Ulbrelvy samples to give you. I will let you know.  If we have success with the prior auth for the [place], we will let you know. In the meantime I will send over Imitrex to your pharmacy.       PLEASE NOTE:  If you had any lab tests please let us know if you have not heard back within a few days. You may see your results on MyChart before we have a chance to review them but we will give you a call once they are reviewed by [place]. If we ordered any referrals today, please let us know if you have not heard from their office within the next week.

## 2021-06-08 NOTE — Assessment & Plan Note (Addendum)
Chronic - current regimen is Elavil, Topamax, Ulbrelvy, but pt has been unable to get Ulbrelvy from pharmacy, reports last provider never able to get, we have started a PA. She has appt with Neuro end of month. Will reach out and see if pt can pick up any samples before then. Sending Imitrex to at least have on hand. Pt having a lot of stress triggers d/t cg for her mother, aunt, and disabled son.

## 2021-06-08 NOTE — Assessment & Plan Note (Addendum)
Chronic - pt does not want to take the Cymbalta any longer, no longer working. Reports hx of Effexor, but had to stop. Does not want weight gain. Discussed options, does not remember trying Wellbutrin, sending 12h 150mg , advised ok to start tomorrow and stop Cymbalta. Advised on use & SE, f/u in 3 months or sooner if dose adjustment needed.

## 2021-07-01 NOTE — Progress Notes (Signed)
NEUROLOGY CONSULTATION NOTE  Alyssa [name] MRN: [id number] DOB: 1965/03/04  Referring provider: Loyal Gambler, MD Primary care provider: Dulce Sellar, NP  Reason for consult:  migraines  Assessment/Plan:   Migraine with aura, without status migrainosus, not intractable - complicated by medication overuse with Fioricet.  Will need to taper off of Fioricet with phenobarbital load to minimize withdrawal She will check with her insurance to find out if [name] is in fact formulary.  If not, I will have her try sample of Trudhesa NS Will have her follow up in 6 weeks for reassessment and further management of frequent migraines.  Plan would be to increase amitriptyline to 100mg  at bedtime   Subjective:  Alyssa [name] is a 56 year old right-handed female with hypothyroidism, mood disorder, and depression who presents for migraines.  History supplemented by prior neurologist's and primary care's notes.  Onset:  56 years old Location:  across forehead and either/both temples Quality:  pounding, throbbing, squeezing Intensity:  8-9/10 Aura:  When severe, sees spots of light (particularly when eyes closed) Prodrome:  absent Associated symptoms:  Photophobia, phonophobia, osmophobia, sometimes nausea.  She denies associated unilateral numbness or weakness. Duration:  1-2 days (if treats early, knock down to 5-6/10) Frequency:  daily for past 4 months. Frequency of abortive medication: Fioricet daily for past 4 months (sometimes 2-3 times a day) Triggers:  Stress/anxiety, loud noise, caffeine, nitrates, MSG Relieving factors:  a little bit of caffeine, peppermint oil on head Activity:  aggravates  Remote CT head on 05/21/2008 personally reviewed was normal.  Current NSAIDS/analgesics:  Fioricet Current triptans:  none Current ergotamine:  none Current anti-emetic:  none Current muscle relaxants:  none Current Antihypertensive medications:  none Current Antidepressant  medications:  amitriptyline 50mg  (75mg  ineffective), QHS, Wellbutrin Current Anticonvulsant medications:  topiramate 50mg  BID Current anti-CGRP:  none Current Antihistamines/Decongestants:  Zyrtec Other therapy:  none Hormone/birth control:  none Other medications:  none She says that her insurance will not cover Vyepti, Botox or Ubrelvy  Past NSAIDS/analgesics:  ibuprofen, naproxen, acetaminophen, Excedrin Past abortive triptans: sumatriptan tab, rizatriptan, eletriptan, zolmitriptan po/NS, [place],  Past abortive ergotamine:  none Past anti-emetic:  Zofran, promethazine Past antihypertensive medications:  propranolol, verapamil Past antidepressant medications:  venlafaxine, duloxetine Past anticonvulsant medications:  Depakote Past anti-CGRP:  Aimovig, Qulipta (effective but no longer effective), Ubrelvy (effective but insurance won't cover), Nurtec Past vitamins/Herbal/Supplements:  magnesium Other past therapies:  Botox (only one injection but no longer covered)  Caffeine:  no Alcohol:  no.  Only 4 times a year Smoker:  no Diet:  Drink water.  Does not skip meals.  Avoids nitrates and MSG Exercise:  not routine Depression/Anxiety:  Yes.  Has emotional stress.  Mother-in-law has dementia.  Her mother had 2 surgeries over the past few months. 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   Anemia remote past   nutritional; slow Fe   Bipolar disorder (HCC)    Depression    Fainting spell    Frequent headaches    Gestational diabetes mellitus, antepartum    Hypothyroidism    Migraine syndrome    Mood disorder (HCC) 10/10/2012   Normal labor 08/10/2013   Status post vacuum-assisted vaginal delivery 08/11/2013   Syncope 10/10/2012    PAST SURGICAL  HISTORY: Past Surgical History:  Procedure Laterality Date   NO PAST SURGERIES     none      MEDICATIONS: Current Outpatient Medications on File Prior to Visit  Medication Sig Dispense Refill   amitriptyline (ELAVIL) 25 MG tablet 1-2 tabs po qhs for migraine prevention 60 tablet 3   buPROPion (WELLBUTRIN SR) 150 MG 12 hr tablet Take 1 tablet (150 mg total) by mouth 2 (two) times daily. 60 tablet 2   butalbital-acetaminophen-caffeine (FIORICET) 50-325-40 MG tablet Take 1 tablet by mouth every 6 (six) hours as needed.     cetirizine (ZYRTEC) 10 MG tablet Take 1 tablet (10 mg total) by mouth daily. 30 tablet 11   SUMAtriptan (IMITREX) 100 MG tablet Take 1 tablet (100 mg total) by mouth as directed. May repeat in 2 hours if headache persists or recurs. 20 tablet 0   topiramate (TOPAMAX) 50 MG tablet 1 tablet 2 (two) times daily.     UBRELVY 100 MG TABS TAKE 1 TABLET BY MOUTH EVERY DAY AS NEEDED 30 tablet 5   No current facility-administered medications on file prior to visit.    ALLERGIES: Allergies  Allergen Reactions   Droperidol     extraparamodol   Ketorolac Tromethamine     extraparamadol   Codeine Nausea And Vomiting, Rash and Other (See Comments)    Extra premidol reaction    FAMILY HISTORY: Family History  Problem Relation Age of Onset   Mental retardation Neg Hx    Diabetes Neg Hx    Heart disease Neg Hx    Cancer Sister    Stroke Maternal Grandmother    Alcohol abuse Paternal Grandfather     Objective:  Blood pressure 138/84, pulse 81, height 5\' 2"  (1.575 m), weight 110 lb 12.8 oz (50.3 kg), SpO2 100 %. General: No acute distress.  Patient appears well-groomed.   Head:  Normocephalic/atraumatic Eyes:  fundi examined but not visualized Neck: supple, no paraspinal tenderness, full range of motion Back: No paraspinal tenderness Heart: regular rate and rhythm Lungs: Clear to auscultation bilaterally. Vascular: No carotid bruits. Neurological Exam: Mental  status: alert and oriented to person, place, and time, recent and remote memory intact, fund of knowledge intact, attention and concentration intact, speech fluent and not dysarthric, language intact. Cranial nerves: CN I: not tested CN II: pupils equal, round and reactive to light, visual fields intact CN III, IV, VI:  full range of motion, no nystagmus, no ptosis CN V: facial sensation intact. CN VII: upper and lower face symmetric CN VIII: hearing intact CN IX, X: gag intact, uvula midline CN XI: sternocleidomastoid and trapezius muscles intact CN XII: tongue midline Bulk & Tone: normal, no fasciculations. Motor:  muscle strength 5/5 throughout Sensation:  Pinprick, temperature and vibratory sensation intact. Deep Tendon Reflexes:  2+ throughout,  toes downgoing.   Finger to nose testing:  Without dysmetria.   Heel to shin:  Without dysmetria.   Gait:  Normal station and stride.  Romberg negative.    Thank you for allowing me to take part in the care of this patient.  , DO

## 2021-07-02 ENCOUNTER — Encounter: Payer: Self-pay | Admitting: Neurology

## 2021-07-02 ENCOUNTER — Ambulatory Visit: Payer: 59 | Admitting: Neurology

## 2021-07-02 ENCOUNTER — Telehealth: Payer: Self-pay | Admitting: Neurology

## 2021-07-02 VITALS — BP 138/84 | HR 81 | Ht 62.0 in | Wt 110.8 lb

## 2021-07-02 DIAGNOSIS — G43109 Migraine with aura, not intractable, without status migrainosus: Secondary | ICD-10-CM | POA: Diagnosis not present

## 2021-07-02 MED ORDER — PHENOBARBITAL 30 MG PO TABS: 30.0000 mg | ORAL_TABLET | Freq: Every day | ORAL | 0 refills | Status: DC

## 2021-07-02 MED ORDER — PHENOBARBITAL 30 MG PO TABS
30.0000 mg | ORAL_TABLET | Freq: Every day | ORAL | 0 refills | Status: DC
Start: 2021-07-02 — End: 2021-08-16

## 2021-07-02 MED ORDER — UBRELVY 100 MG PO TABS: 100.0000 mg | ORAL_TABLET | ORAL | 5 refills | Status: DC | PRN

## 2021-07-02 NOTE — Telephone Encounter (Signed)
Patient went to [facility], needs 8 tablets of Ubrelvy 100mg  sent in for PA. If you have questions give her a call.

## 2021-07-02 NOTE — Patient Instructions (Signed)
  Must taper off of butalbital-caffeine-acetaminophen (Fioricet).  To help taper off of it and to minimize withdrawal effects, will start you on phenobarbital 30mg  at bedtime Week 1:  Take phenobarbital 30mg  at bedtime.  Decrease Fioricet to 3 days a week Week 2:  Continue phenobarbital 30mg  bedtime. Decrease Fioricet to 2 days a week Week 3:  Continue phenobarbital 30mg  bedtime.  Decrease Fioricet to 1 days a week Week 4:  Continue phenobarbital 30mg  bedtime.  Stop Fioricet Week 5:  Stop phenobarbital Continue amitriptyline 50mg  at bedtime and topiramate 50mg  twice daily for now.  Plan will be to increase amitriptyline to help with migraines and insomnia.   Take Trudhesa nasal spray at earliest onset of headache.  First pump 4 times.  Then 1 pump in each nostril.  May repeat dose once in 1 hour with new applicator if needed.  Maximum 2 doses in 24 hours.  Let me know if effective. Limit use of pain relievers to no more than 2 days out of the week.  These medications include acetaminophen, NSAIDs (ibuprofen/Advil/Motrin, naproxen/Aleve, triptans (Imitrex/sumatriptan), Excedrin, and narcotics.  This will help reduce risk of rebound headaches. Be aware of common food triggers:  - Caffeine:  coffee, black tea, cola, Mt. Dew  - Chocolate  - Dairy:  aged cheeses (brie, blue, cheddar, gouda, [place], provolone, [place], Swiss, etc), chocolate milk, buttermilk, sour cream, limit eggs and yogurt  - Nuts, peanut butter  - Alcohol  - Cereals/grains:  FRESH breads (fresh bagels, sourdough, doughnuts), yeast productions  - Processed/canned/aged/cured meats (pre-packaged deli meats, hotdogs)  - MSG/glutamate:  soy sauce, flavor enhancer, pickled/preserved/marinated foods  - Sweeteners:  aspartame (Equal, Nutrasweet).  Sugar and Splenda are okay  - Vegetables:  legumes (lima beans, lentils, snow peas, fava beans, pinto peans, peas, garbanzo beans), sauerkraut, onions, olives, pickles  - Fruit:  avocados,  bananas, citrus fruit (orange, lemon, grapefruit), mango  - Other:  Frozen meals, macaroni and cheese Routine exercise Stay adequately hydrated (aim for 64 oz water daily) Keep headache diary Maintain proper stress management Maintain proper sleep hygiene Do not skip meals Consider supplements:  magnesium citrate 400mg  daily, riboflavin 400mg  daily, coenzyme Q10 100mg  three times daily.

## 2021-07-02 NOTE — Progress Notes (Signed)
Medication Samples have been provided to the patient.  Drug name: trudesa       Strength: 0.725 mg        Qty: 2  LOT: [id number]  Exp.Date: 12/02/2021  Dosing instructions: prime 4 squirts and use one squirt each nostril may repeat in an hour   The patient has been instructed regarding the correct time, dose, and frequency of taking this medication, including desired effects and most common side effects.   Alyssa [name] 12:09 PM 07/02/2021   Medication Samples have been provided to the patient.  Drug name: [name]       Strength: 100mg         Qty: 2  LOT  Exp.Date: 09/2023  Dosing instructions: use as needed may repeat in 2 hours no more than 2 in 24 hours   The patient has been instructed regarding the correct time, dose, and frequency of taking this medication, including desired effects and most common side effects.   10/2023 12:10 PM 07/02/2021

## 2021-07-05 NOTE — Telephone Encounter (Signed)
Per Patient: She was told to make sure we put the past triptans she has taken,  Past abortive triptans: sumatriptan tab, rizatriptan, eletriptan, zolmitriptan po/NS, Amerge  As well patient has taken, Almotriptan.

## 2021-07-07 ENCOUNTER — Telehealth (HOSPITAL_COMMUNITY): Payer: Self-pay | Admitting: Pharmacy Technician

## 2021-07-07 ENCOUNTER — Other Ambulatory Visit (HOSPITAL_COMMUNITY): Payer: Self-pay

## 2021-07-07 NOTE — Telephone Encounter (Signed)
Patient Advocate Encounter   Received notification that prior authorization for Ubrelvy 100MG  tablets is required.   PA submitted on 07/07/2021 Key [id number] Status is pending       09/07/2021, CPhT Pharmacy Patient Advocate Specialist [facility] Patient Advocate Team Direct Number: [phone]  Fax: [phone]

## 2021-07-07 NOTE — Telephone Encounter (Signed)
Patient Advocate Encounter  Prior Authorization for [name] 100MG  tablets has been approved.    PA# Effective dates: 07/07/2021 through 07/08/2022      09/08/2022, CPhT Pharmacy Patient Advocate Specialist [facility] Patient Advocate Team Direct Number: [phone]  Fax: [phone]

## 2021-07-07 NOTE — Telephone Encounter (Signed)
LVM for patient Pa approved. Telephone call [facility] Rep advised.

## 2021-07-08 MED ORDER — UBRELVY 100 MG PO TABS: 100.0000 mg | ORAL_TABLET | ORAL | 5 refills | Status: DC | PRN

## 2021-07-08 NOTE — Addendum Note (Signed)
Addended by: Leida Lauth on: 07/08/2021 04:29 PM   Modules accepted: Orders

## 2021-07-08 NOTE — Telephone Encounter (Signed)
Per patient, She was advised that the Urblevy was sent to the wrong [facility] and the cost was $900.   Spoke to [employer], Per rep script with through as 8 tab for 5 days instead of 8 tabs for 5 refills.

## 2021-07-27 ENCOUNTER — Telehealth: Payer: Self-pay | Admitting: Neurology

## 2021-07-27 ENCOUNTER — Other Ambulatory Visit: Payer: Self-pay | Admitting: Neurology

## 2021-07-27 MED ORDER — TOPIRAMATE 50 MG PO TABS
50.0000 mg | ORAL_TABLET | Freq: Two times a day (BID) | ORAL | 5 refills | Status: DC
Start: 2021-07-27 — End: 2021-12-16

## 2021-07-27 NOTE — Telephone Encounter (Signed)
Pt called an informed that Topiramate 50mg  twice daily has been sent to her pharmacy

## 2021-07-27 NOTE — Telephone Encounter (Signed)
Pt called in wanting to see if she can get a prescription for generic Topamax 50 mg. She had been getting it through her previous neurologist, but she doesn't have anymore refills. She would like it sent to [facility] on [address]

## 2021-08-02 ENCOUNTER — Other Ambulatory Visit: Payer: Self-pay | Admitting: Neurology

## 2021-08-15 NOTE — Progress Notes (Signed)
NEUROLOGY FOLLOW UP OFFICE NOTE  BRITNEE MCDEVITT [id number]  Assessment/Plan:   Migraine with aura, without status migrainosus, not intractable   Start Ajovy every 28 days for migraine prevention For second line if Ubrelvy ineffective, Tosymra NS (sent to [facility]) Headache diary Limit use of pain relievers to no more than 2 days out of week to prevent risk of rebound or medication-overuse headache. Follow up 4 months     Subjective:  JOURNEE BOBROWSKI is a 56 year old right-handed female with hypothyroidism, mood disorder, and depression who follows up for migraines.  UPDATE: She was tapered off of Fioricet with phenobarbital load.  Truhesa ineffective.    Intensity:  Severe Duration:  1 day, one lasted 3 days in July, 1 lasted 2 days and 1 lasted 3 days in August Frequency:  6 in July; 5 so far in August Current NSAIDS/analgesics:  Fioricet Current triptans:  none Current ergotamine:  none Current anti-emetic:  none Current muscle relaxants:  none Current Antihypertensive medications:  none Current Antidepressant medications:  amitriptyline 75mg  QHS, duloxetine Current Anticonvulsant medications:  topiramate 50mg  BID Current anti-CGRP:  Ubrelvy 100mg  Current Antihistamines/Decongestants:  Zyrtec Other therapy:  none Hormone/birth control:  none Other medications:  none She says that her insurance will not cover Vyepti or Botox Caffeine:  no Alcohol:  no.  Only 4 times a year Smoker:  no Diet:  Drink water.  Does not skip meals.  Avoids nitrates and MSG Exercise:  not routine Depression/Anxiety:  Yes.  Has emotional stress.  Mother-in-law has dementia.  Her mother had 2 surgeries over the past few months. Sleep hygiene:  Poor since starting Wellbutrin Currently in menopause  HISTORY:  Onset:  56 years old Location:  across forehead and either/both temples Quality:  pounding, throbbing, squeezing Intensity:  8-9/10 Aura:  When severe, sees spots of  light (particularly when eyes closed) Prodrome:  absent Associated symptoms:  Photophobia, phonophobia, osmophobia, sometimes nausea.  She denies associated unilateral numbness or weakness. Duration:  1-2 days (if treats early, knock down to 5-6/10) Frequency:  daily for past 4 months. Frequency of abortive medication: Fioricet daily for past 4 months (sometimes 2-3 times a day) Triggers:  Stress/anxiety, loud noise, caffeine, nitrates, MSG, humid/hot weather Relieving factors:  a little bit of caffeine, peppermint oil on head Activity:  aggravates   Remote CT head on 05/21/2008 was normal.    Past NSAIDS/analgesics:  ibuprofen, naproxen, acetaminophen, Excedrin Past abortive triptans: sumatriptan tab, rizatriptan, eletriptan, zolmitriptan po/NS, Amerge,  Past abortive ergotamine:  none Past anti-emetic:  Zofran, promethazine Past antihypertensive medications:  propranolol, verapamil Past antidepressant medications:  venlafaxine, Wellbutrin Past anticonvulsant medications:  Depakote Past anti-CGRP:  Aimovig, Qulipta (effective but no longer effective), Ubrelvy (effective but insurance won't cover), Nurtec Past vitamins/Herbal/Supplements:  magnesium Other past therapies:  Botox (only one injection but no longer covered)    Family history of headache:  mother (migraines)  PAST MEDICAL HISTORY: 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   Anemia remote past   nutritional; slow Fe   Bipolar disorder (HCC)    Depression    Fainting spell    Frequent headaches    Gestational diabetes mellitus, antepartum    Hypothyroidism    Migraine syndrome    Mood disorder (HCC) 10/10/2012   Normal labor 08/10/2013   Status post vacuum-assisted vaginal delivery 08/11/2013   Syncope 10/10/2012    MEDICATIONS: Current  Outpatient Medications on File Prior to Visit  Medication Sig Dispense Refill   amitriptyline  (ELAVIL) 25 MG tablet 1-2 tabs po qhs for migraine prevention 60 tablet 3   buPROPion (WELLBUTRIN SR) 150 MG 12 hr tablet Take 1 tablet (150 mg total) by mouth 2 (two) times daily. 60 tablet 2   butalbital-acetaminophen-caffeine (FIORICET) 50-325-40 MG tablet Take 1 tablet by mouth every 6 (six) hours as needed.     cetirizine (ZYRTEC) 10 MG tablet Take 1 tablet (10 mg total) by mouth daily. 30 tablet 11   PHENObarbital (LUMINAL) 30 MG tablet Take 1 tablet (30 mg total) by mouth at bedtime. 30 tablet 0   topiramate (TOPAMAX) 50 MG tablet Take 1 tablet (50 mg total) by mouth 2 (two) times daily. 60 tablet 5   Ubrogepant (UBRELVY) 100 MG TABS Take 100 mg by mouth as needed. 8 tablet 5   No current facility-administered medications on file prior to visit.    ALLERGIES: Allergies  Allergen Reactions   Droperidol     extraparamodol   Ketorolac Tromethamine     extraparamadol   Codeine Nausea And Vomiting, Rash and Other (See Comments)    Extra premidol reaction    FAMILY HISTORY: Family History  Problem Relation Age of Onset   Mental retardation Neg Hx    Diabetes Neg Hx    Heart disease Neg Hx    Cancer Sister    Stroke Maternal Grandmother    Alcohol abuse Paternal Grandfather       Objective:  Blood pressure 117/78, pulse 91, height 5\' 2"  (1.575 m), weight 110 lb 9.6 oz (50.2 kg), SpO2 100 %. General: No acute distress.  Patient appears well-groomed.     , DO  CC: Shon Millet, NP

## 2021-08-16 ENCOUNTER — Ambulatory Visit: Payer: 59 | Admitting: Neurology

## 2021-08-16 ENCOUNTER — Encounter: Payer: Self-pay | Admitting: Neurology

## 2021-08-16 VITALS — BP 117/78 | HR 91 | Ht 62.0 in | Wt 110.6 lb

## 2021-08-16 DIAGNOSIS — G43109 Migraine with aura, not intractable, without status migrainosus: Secondary | ICD-10-CM | POA: Diagnosis not present

## 2021-08-16 MED ORDER — TOSYMRA 10 MG/ACT NA SOLN
NASAL | 5 refills | Status: DC
Start: 2021-08-16 — End: 2021-08-25

## 2021-08-16 MED ORDER — AJOVY 225 MG/1.5ML ~~LOC~~ SOAJ: 225.0000 mg | SUBCUTANEOUS | 5 refills | Status: DC

## 2021-08-16 NOTE — Progress Notes (Signed)
Medication Samples have been provided to the patient.  Drug name: Montey Hora       Strength: 100 mg        Qty: 4  LOT: [id number]  Exp.Date: 09/2023  Dosing instructions: as needed  The patient has been instructed regarding the correct time, dose, and frequency of taking this medication, including desired effects and most common side effects.   Leida Lauth 1:27 PM 08/16/2021

## 2021-08-16 NOTE — Patient Instructions (Signed)
For second line (when [name] does not work), try Tosymra - 1 spray in one nostril.  May repeat after one hour.  Maximum 2 sprays in 24 hours Start Ajovy every 28 days Continue headache diary. Follow up 4 months.

## 2021-08-19 ENCOUNTER — Telehealth (HOSPITAL_COMMUNITY): Payer: Self-pay | Admitting: Pharmacy Technician

## 2021-08-19 NOTE — Telephone Encounter (Signed)
Patient Advocate Encounter   Received notification that prior authorization for Tosymra 10MG /ACT Nasal Spray is required.   PA submitted on 08/19/2021 Key [id number] Status is pending       08/21/2021, CPhT Pharmacy Patient Advocate Specialist [facility] Patient Advocate Team Direct Number: [phone]  Fax: [phone]

## 2021-08-19 NOTE — Telephone Encounter (Signed)
Patient Advocate Encounter  Received notification that the request for prior authorization for Tosymra 10MG /ACT Nasal Spray  has been denied due to The requested medication and/or diagnosis are not a covered benefit and excluded from coverage in accordance with the terms and conditions of your plan benefit. Therefore, the request has been administratively denied. , CPhT Pharmacy Patient Advocate Specialist [facility] Patient Advocate Team Direct Number: [phone]  Fax: [phone]

## 2021-08-25 ENCOUNTER — Other Ambulatory Visit: Payer: Self-pay | Admitting: Neurology

## 2021-08-25 MED ORDER — SUMATRIPTAN 20 MG/ACT NA SOLN: NASAL | 5 refills | Status: DC

## 2021-08-25 NOTE — Telephone Encounter (Signed)
Spoke to [facility] given one fill for free and refill will cost $90. Per Patient she can not afford that.  As for the Ajovy she is unable to give herself the injection and with her husband schedule he is unable to help.   Please advise.

## 2021-08-27 NOTE — Telephone Encounter (Signed)
Tried calling patient, no answer. LMOVM,Please give th office a call so we can go over the recommendations from Dr.Jaffe,  1. I sent a prescription in for generic sumatriptan nasal spray.  2. For preventative, we can try Vyepti, which is an IV infusion every 3 months.  She has tried Aimovig but is unable to use an injection

## 2021-08-31 ENCOUNTER — Encounter: Payer: Self-pay | Admitting: Neurology

## 2021-08-31 NOTE — Telephone Encounter (Unsigned)
Patient advised, Vyepti order sent to [facility].

## 2021-09-01 ENCOUNTER — Telehealth: Payer: Self-pay | Admitting: Pharmacy Technician

## 2021-09-01 NOTE — Telephone Encounter (Addendum)
Auth Submission:  APPROVED Payer: [employer] Medication & CPT/J Code(s) submitted: Vyepti (Eptinezumab) [id number] Route of submission (phone, fax, portal): PORTAL Phone # Fax # Auth type: Buy/Bill Units/visits requested: 100MG  Q3 MONTHS Reference number: Approval from: 8/0/23  TO  12/02/21    Vyepti co-pay card: Approved Medical claim processing: Payor Id: 12/04/21 Gr: [id number]  Member id: [id number]  Pharmacy claim processing: Bin: [id number] Gr: [id number] Member id: [id number] Fax eob/claim: [phone]

## 2021-09-07 ENCOUNTER — Encounter: Payer: Self-pay | Admitting: Neurology

## 2021-09-15 ENCOUNTER — Telehealth: Payer: Self-pay | Admitting: Neurology

## 2021-09-15 NOTE — Telephone Encounter (Signed)
Patient called and said her insurance will not pay for her infusions at [facility]. She'd like a call back about this.

## 2021-09-15 NOTE — Telephone Encounter (Signed)
Per Selena Batten at [facility], Auth Submission:  APPROVED Payer: [facility] Medication & CPT/J Code(s) submitted: Vyepti (Eptinezumab) [phone] Route of submission (phone, fax, portal): PORTAL Phone # Fax # Auth type: Buy/Bill Units/visits requested: 100MG  Q3 MONTHS Reference number: Approval from: 8/0/23  TO  12/02/21      Vyepti co-pay card: Approved Medical claim processing: Payor Id: 12/04/21 Gr: [id number]  Member id: [id number]   Pharmacy claim processing: Bin: [id number] Gr: [id number] Member id: [id number] Fax eob/claim: [phone].  LMOVm for patient, [phone] patient has questions.

## 2021-10-04 ENCOUNTER — Ambulatory Visit (INDEPENDENT_AMBULATORY_CARE_PROVIDER_SITE_OTHER): Payer: 59

## 2021-10-04 VITALS — BP 131/89 | HR 83 | Temp 98.1°F | Resp 16 | Ht 62.0 in | Wt 108.2 lb

## 2021-10-04 DIAGNOSIS — G43109 Migraine with aura, not intractable, without status migrainosus: Secondary | ICD-10-CM | POA: Diagnosis not present

## 2021-10-04 MED ORDER — SODIUM CHLORIDE 0.9 % IV SOLN
100.0000 mg | Freq: Once | INTRAVENOUS | Status: AC
  Administered 2021-10-04: 100 mg via INTRAVENOUS
  Filled 2021-10-04: qty 1

## 2021-10-04 NOTE — Progress Notes (Signed)
Diagnosis: Migraine   Provider:  Marshell Garfinkel MD  Procedure: Infusion  IV Type: Peripheral, IV Location: R Antecubital  Vyepti (Eptinezumab-jjmr), Dose: 100 mg  Infusion Start Time: 9470  Infusion Stop Time: 9628  Post Infusion IV Care: Observation period completed and Peripheral IV Discontinued  Discharge: Condition: Good, Destination: Home . AVS provided to patient.   Performed by:  Adelina Mings, LPN

## 2021-10-06 ENCOUNTER — Ambulatory Visit: Payer: 59

## 2021-10-25 ENCOUNTER — Other Ambulatory Visit: Payer: Self-pay

## 2021-10-25 ENCOUNTER — Telehealth: Payer: Self-pay | Admitting: Anesthesiology

## 2021-10-25 MED ORDER — AMITRIPTYLINE HCL 75 MG PO TABS: 75.0000 mg | ORAL_TABLET | Freq: Every day | ORAL | 1 refills | Status: DC

## 2021-10-25 NOTE — Telephone Encounter (Signed)
Prescription sent patient called  

## 2021-10-25 NOTE — Telephone Encounter (Signed)
Patient called requesting a refill on her medication Amitriptyline 75 mg.

## 2021-11-04 ENCOUNTER — Telehealth: Payer: Self-pay | Admitting: Anesthesiology

## 2021-11-04 NOTE — Telephone Encounter (Signed)
Patient has questions about her medications. She didn't say which medication. Requests a call back.

## 2021-11-04 NOTE — Telephone Encounter (Signed)
Per Patient she received a bill for Vyepti and has a question about. Patient asking for a call back.  Alyssa Mercury do you mind calling the patient back.   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   Patient states that their has been increase of migraines after the Vyepti.   Please advise

## 2021-11-08 NOTE — Telephone Encounter (Signed)
Spoke with patient on 11/05/21 in regards to her bill.  She was approved for co-pay card. Explained co-pay benefits.  Patient understood and had no further question at that time.

## 2021-11-09 ENCOUNTER — Encounter: Payer: Self-pay | Admitting: Neurology

## 2021-11-19 ENCOUNTER — Telehealth: Payer: Self-pay | Admitting: Neurology

## 2021-11-19 NOTE — Telephone Encounter (Signed)
Pt called in and left a message with the access nurse. She would like to pick up some Ubrelvy samples for a procedure next week.

## 2021-11-19 NOTE — Telephone Encounter (Signed)
Samples picked up by patient.  Medication Samples have been provided to the patient.  Drug name: Montey Hora       Strength: 100        Qty: 4  LOT: [id number]  Exp.Date: 09/2023  Dosing instructions: as needed  The patient has been instructed regarding the correct time, dose, and frequency of taking this medication, including desired effects and most common side effects.   Leida Lauth 4:16 PM 11/19/2021

## 2021-11-19 NOTE — Telephone Encounter (Signed)
Patient okay to pick up samples

## 2021-12-09 ENCOUNTER — Other Ambulatory Visit: Payer: Self-pay | Admitting: Neurology

## 2021-12-14 NOTE — Progress Notes (Unsigned)
NEUROLOGY FOLLOW UP OFFICE NOTE  Alyssa [name] [id number]  Assessment/Plan:   Migraine with aura, without status migrainosus, not intractable Depression and anxiety   She did well with first infusion of Vyepti but I think we can further optimize migraine frequency control - increase to 300mg  every 3 months.  She has tried several oral preventatives and cannot tolerate injections (significant phobia) Migraine rescue:  Ubrelvy or sumatriptan 20mg  NS (second line) To address depression and anxiety:  transition from duloxetine to sertraline 25mg  daily.  If no improvement after 6 weeks, increase to 50mg  daily. Headache diary Limit use of pain relievers to no more than 2 days out of week to prevent risk of rebound or medication-overuse headache. Follow up 4 months     Subjective:  Alyssa [name] is a 56 year old right-handed female with hypothyroidism, mood disorder, and depression who follows up for migraines.   UPDATE: Insurance wouldn't cover Ajovy.  Her insurance recommended trying Vyepti.  Started Vyepti.  Went for the first infusion but then her insurance said they wouldn't cover it.  She noticed improvement after the first infusion (didn't have a migraine for 1 and a half months) but hasn't had any subsequent infusions.   Tosymra not covered.  Cannot tolerate sumatriptan [place].  Sumatriptan NS generic effective.  Intensity:  Severe Duration:  up to an hour with sumatriptan NS or Ubrelvy Frequency:  7 to 8 days a month Current NSAIDS/analgesics:  Fioricet Current triptans:  sumatriptan 20mg  NS Current ergotamine:  none Current anti-emetic:  none Current muscle relaxants:  none Current Antihypertensive medications:  none Current Antidepressant medications:  amitriptyline 75mg  QHS, duloxetine 60mg  daily Current Anticonvulsant medications:  topiramate 50mg  BID Current anti-CGRP:  Ubrelvy 100mg  Current Antihistamines/Decongestants:  Zyrtec Other therapy:  none Hormone/birth  control:  none Other medications:  none She says that her insurance will not cover Vyepti or Botox Caffeine:  no Alcohol:  no.  Only 4 times a year Smoker:  no Diet:  Drink water.  Does not skip meals.  Avoids nitrates and MSG Exercise:  not routine Depression/Anxiety:  Yes.  Significant.  Has emotional stress.  Mother-in-law has dementia.  Her mother had 2 surgeries over the past few months.  She is also concerned about her daughter.  It is affecting her migraines. Sleep hygiene:  Poor since starting Wellbutrin Currently in menopause   HISTORY:  Onset:  56 years old Location:  across forehead and either/both temples Quality:  pounding, throbbing, squeezing Intensity:  8-9/10 Aura:  When severe, sees spots of light (particularly when eyes closed) Prodrome:  absent Associated symptoms:  Photophobia, phonophobia, osmophobia, sometimes nausea.  She denies associated unilateral numbness or weakness. Duration:  1-2 days (if treats early, knock down to 5-6/10) Frequency:  daily for past 4 months. Frequency of abortive medication: Fioricet daily for past 4 months (sometimes 2-3 times a day) Triggers:  Stress/anxiety, loud noise, caffeine, nitrates, MSG, humid/hot weather Relieving factors:  a little bit of caffeine, peppermint oil on head Activity:  aggravates   Remote CT head on 05/21/2008 was normal.     Past NSAIDS/analgesics:  ibuprofen, naproxen, acetaminophen, Excedrin Past abortive triptans: sumatriptan tab, rizatriptan, eletriptan, zolmitriptan po/NS, Amerge, Tosymra NS, sumatriptan [place] (cannot tolerate giving self injection) Past abortive ergotamine:  none Past anti-emetic:  Zofran, promethazine Past antihypertensive medications:  propranolol, verapamil Past antidepressant medications:  venlafaxine, Wellbutrin Past anticonvulsant medications:  Depakote Past anti-CGRP:  Aimovig, Qulipta (effective but no longer effective), Ubrelvy (effective but insurance  won't cover),  Nurtec Past vitamins/Herbal/Supplements:  magnesium Other past therapies:  Botox (only one injection but no longer covered)     Family history of headache:  mother (migraines)  PAST MEDICAL HISTORY: 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 90 tablet 1   cetirizine (ZYRTEC) 10 MG tablet Take 1 tablet (10 mg total) by mouth daily. 30 tablet 11   Fremanezumab-vfrm (AJOVY) 225 MG/1.5ML SOAJ Inject 225 mg into the skin every 28 (twenty-eight) days. 1.68 mL 5   SUMAtriptan (IMITREX) 20 MG/ACT nasal spray 1 spray into nostril.  May repeat in 2 hours if headache persists or recurs.  Maximum 2 sprays in 24 hours 6 each 5   topiramate (TOPAMAX) 50 MG tablet Take 1 tablet (50 mg total) by mouth 2 (two) times daily. 60 tablet 5   Ubrogepant (UBRELVY) 100 MG TABS Take 100 mg by mouth as needed. 8 tablet 5   No current facility-administered medications on file prior to visit.    ALLERGIES: Allergies  Allergen Reactions   Droperidol     extraparamodol   Ketorolac Tromethamine     extraparamadol   Codeine Nausea And Vomiting, Rash and Other (See Comments)    Extra premidol reaction    FAMILY HISTORY: Family History  Problem Relation Age of Onset   Mental retardation Neg Hx    Diabetes Neg Hx    Heart disease Neg Hx    Cancer Sister    Stroke Maternal Grandmother    Alcohol  abuse Paternal Grandfather       Objective:  Blood pressure 112/70, pulse 96, height 5\' 3"  (1.6 m), weight 111 lb (50.3 kg), SpO2 98 %. General: No acute distress.  Patient appears well-groomed.     , DO  CC: Shon Millet, NP

## 2021-12-16 ENCOUNTER — Ambulatory Visit: Payer: 59 | Admitting: Neurology

## 2021-12-16 ENCOUNTER — Encounter: Payer: Self-pay | Admitting: Neurology

## 2021-12-16 VITALS — BP 112/70 | HR 96 | Ht 63.0 in | Wt 111.0 lb

## 2021-12-16 DIAGNOSIS — G43109 Migraine with aura, not intractable, without status migrainosus: Secondary | ICD-10-CM

## 2021-12-16 DIAGNOSIS — F32A Depression, unspecified: Secondary | ICD-10-CM | POA: Diagnosis not present

## 2021-12-16 DIAGNOSIS — F419 Anxiety disorder, unspecified: Secondary | ICD-10-CM

## 2021-12-16 MED ORDER — UBRELVY 100 MG PO TABS: 100.0000 mg | ORAL_TABLET | ORAL | 5 refills | Status: DC | PRN

## 2021-12-16 MED ORDER — SERTRALINE HCL 25 MG PO TABS: 25.0000 mg | ORAL_TABLET | Freq: Every day | ORAL | 5 refills | Status: DC

## 2021-12-16 MED ORDER — TOPIRAMATE 50 MG PO TABS: 50.0000 mg | ORAL_TABLET | Freq: Two times a day (BID) | ORAL | 5 refills | Status: DC

## 2021-12-16 MED ORDER — DULOXETINE HCL 30 MG PO CPEP: ORAL_CAPSULE | ORAL | 0 refills | Status: DC

## 2021-12-16 NOTE — Addendum Note (Signed)
Addended by: Leida Lauth on: 12/16/2021 10:51 AM   Modules accepted: Orders

## 2021-12-16 NOTE — Patient Instructions (Signed)
Switch from duloxetine to sertraline: Stop 60mg  duloxetine.  Take duloxetine 30mg  daily for 14 days, then stop. Once you have stopped duloxetine, start sertraline 25mg  daily.  If no improvement in depression/anxiety in 6 weeks, contact me. Plan for Vyepti 300mg  every 3 months Ubrelvy or sumatriptan nasal spray Follow up 4-5 months.

## 2021-12-17 ENCOUNTER — Telehealth: Payer: Self-pay | Admitting: Pharmacy Technician

## 2021-12-17 NOTE — Telephone Encounter (Signed)
Dr. Everlena Cooper, [place] note:  Vyepti increase: 300mg   Auth Submission: APPROVED Payer: [employer] Medication & CPT/J Code(s) submitted: Vyepti (Eptinezumab) [phone] Route of submission (phone, fax, portal): [facility] Phone # Fax # Auth type: Buy/Bill Units/visits requested: X2 DOSES Reference number: [facility] Approval from: 12/17/21 to 12/18/22

## 2021-12-20 ENCOUNTER — Ambulatory Visit: Payer: 59 | Admitting: Neurology

## 2021-12-31 ENCOUNTER — Other Ambulatory Visit: Payer: Self-pay | Admitting: Neurology

## 2022-01-10 ENCOUNTER — Ambulatory Visit: Payer: 59

## 2022-01-25 ENCOUNTER — Ambulatory Visit (INDEPENDENT_AMBULATORY_CARE_PROVIDER_SITE_OTHER): Payer: 59

## 2022-01-25 ENCOUNTER — Telehealth: Payer: Self-pay

## 2022-01-25 ENCOUNTER — Telehealth: Payer: Self-pay | Admitting: Anesthesiology

## 2022-01-25 DIAGNOSIS — G43109 Migraine with aura, not intractable, without status migrainosus: Secondary | ICD-10-CM | POA: Diagnosis not present

## 2022-01-25 MED ORDER — KETOROLAC TROMETHAMINE 60 MG/2ML IM SOLN
60.0000 mg | Freq: Once | INTRAMUSCULAR | Status: AC
  Administered 2022-01-25: 60 mg via INTRAMUSCULAR

## 2022-01-25 MED ORDER — DIPHENHYDRAMINE HCL 50 MG/ML IJ SOLN
50.0000 mg | Freq: Once | INTRAMUSCULAR | Status: AC
  Administered 2022-01-25: 50 mg via INTRAMUSCULAR

## 2022-01-25 MED ORDER — METOCLOPRAMIDE HCL 5 MG/ML IJ SOLN
10.0000 mg | Freq: Once | INTRAMUSCULAR | Status: AC
  Administered 2022-01-25: 10 mg via INTRAMUSCULAR

## 2022-01-25 NOTE — Telephone Encounter (Signed)
Pt called stating she needs to pick up samples of Ubrelvy 100 mg today also pt would like you to call Kim at infusion center to see if she can help get her infusion approved by her insurance.

## 2022-01-25 NOTE — Telephone Encounter (Signed)
Per [employer] note 12/17/21 PA approval for Vyepti.   Samples needed for [employer]

## 2022-01-25 NOTE — Progress Notes (Signed)
Spoke to patient in regards to allergy to Toradol. Per Patient she is able to take Toradol. Advised Dr.Jaffe of patient comment. Okay to give Toradol

## 2022-01-25 NOTE — Telephone Encounter (Signed)
Patient called in asking if she can come in now for a migraine shot. Needs to come in before 1pm as she has to pick her daughter up from school.

## 2022-02-15 ENCOUNTER — Ambulatory Visit: Payer: 59

## 2022-03-21 ENCOUNTER — Telehealth: Payer: Self-pay

## 2022-03-21 ENCOUNTER — Other Ambulatory Visit: Payer: Self-pay | Admitting: Neurology

## 2022-03-21 MED ORDER — TOPIRAMATE 50 MG PO TABS: ORAL_TABLET | ORAL | 0 refills | Status: DC

## 2022-03-21 NOTE — Telephone Encounter (Signed)
LMOVM for patient to call the office back.

## 2022-03-21 NOTE — Telephone Encounter (Signed)
Patient is calling in asking for a call back, states she is not going to be able to receive the infusion and would give the reasoning behind it once a call is returned. Also wanting to pick up samples  Ubrogepant (UBRELVY) 100 MG TABS, also asking if Dr.Jaffe would be able to renew topamax prescription and if not then what other options are there.

## 2022-03-21 NOTE — Telephone Encounter (Signed)
Per patient she will not be able to continue the Infusion right now.   Patient want to know if she could go back on the Topiramate.   And if she can get samples of urblevy.  Patient states If she has to come in to discuss another preventive.

## 2022-03-21 NOTE — Addendum Note (Signed)
Addended by: Venetia Night on: 03/21/2022 03:48 PM   Modules accepted: Orders

## 2022-03-29 ENCOUNTER — Other Ambulatory Visit: Payer: Self-pay | Admitting: Neurology

## 2022-04-25 ENCOUNTER — Other Ambulatory Visit: Payer: Self-pay | Admitting: Neurology

## 2022-04-27 ENCOUNTER — Other Ambulatory Visit: Payer: Self-pay | Admitting: Neurology

## 2022-05-02 NOTE — Progress Notes (Unsigned)
Patient ID: Alyssa [name], female    DOB: 02/08/1965, 57 y.o.   MRN: [id number] S Chief Complaint  Patient presents with   Arthritis    Pt c/o Arthritis in left foot for 3 days, discuss pain management. Would like to discuss naturals supplement such as arnica topical gel ad tumeric capsule.     HPI:      Foot pain: has known arthritis of left great toe & the pain is now moving down her foot and over last few days has been worsened. She is on her feet a lot during the day and wears shoes appropriate for her arch and are comfortable most of the time.       Assessment & Plan:  1. Primary osteoarthritis of left foot - sending Mobic, advised on use & SE, take prn on bad days or days where will be standing/walking more than usual. Can take OTC Turmerc Curcumin tid, advised on what kind to look for, can also try OTC diclofenac sodium gel qid prn. Call back if pain is not improving.  - meloxicam (MOBIC) 15 MG tablet; Take 0.5 tablets (7.5 mg total) by mouth 2 (two) times daily as needed for pain.  Dispense: 30 tablet; Refill: 5   Subjective:    Outpatient Medications Prior to Visit  Medication Sig Dispense Refill   amitriptyline (ELAVIL) 75 MG tablet Take 75 mg by mouth at bedtime.     cetirizine (ZYRTEC) 10 MG tablet Take 1 tablet (10 mg total) by mouth daily. 30 tablet 11   sertraline (ZOLOFT) 25 MG tablet TAKE 1 TABLET(25 MG) BY MOUTH DAILY 30 tablet 5   topiramate (TOPAMAX) 50 MG tablet take 1/2 tablet at bedtime for a week, then 1/2 tablet twice daily for a week, then increase to 1 tablet twice daily 60 tablet 0   Ubrogepant (UBRELVY) 100 MG TABS Take 100 mg by mouth as needed. May repeat after 2 hours.  Maximum 2 tablets in 24 hours. 8 tablet 5   SUMAtriptan (IMITREX) 20 MG/ACT nasal spray 1 spray into nostril.  May repeat in 2 hours if headache persists or recurs.  Maximum 2 sprays in 24 hours (Patient not taking: Reported on 05/03/2022) 6 each 5   No facility-administered medications  prior to visit.   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   Anemia remote past   nutritional; slow Fe   Bipolar disorder (HCC)    Depression    Fainting spell    Frequent headaches    Gestational diabetes mellitus, antepartum    Hypothyroidism    Migraine syndrome    Mood disorder (HCC) 10/10/2012   Normal labor 08/10/2013   Status post vacuum-assisted vaginal delivery 08/11/2013   Syncope 10/10/2012   Past Surgical History:  Procedure Laterality Date   NO PAST SURGERIES     none     Allergies  Allergen Reactions   Droperidol     extraparamodol   Ketorolac Tromethamine     extraparamadol   Codeine Nausea And Vomiting, Rash and Other (See Comments)    Extra premidol reaction      Objective:    Physical Exam Vitals and nursing note reviewed.  Constitutional:      Appearance: Normal appearance.  Cardiovascular:     Rate and Rhythm: Normal rate and regular rhythm.  Pulmonary:     Effort: Pulmonary effort is normal.     Breath sounds: Normal  breath sounds.  Musculoskeletal:        General: Normal range of motion.     Left foot: Normal range of motion. Bony tenderness present. No swelling or deformity.  Skin:    General: Skin is warm and dry.  Neurological:     Mental Status: She is alert.  Psychiatric:        Mood and Affect: Mood normal.        Behavior: Behavior normal.    BP 113/75 (BP Location: Left Arm, Patient Position: Sitting, Cuff Size: Normal)   Pulse 90   Temp 98 F (36.7 C) (Temporal)   Ht 5\' 3"  (1.6 m)   Wt 110 lb 12.8 oz (50.3 kg)   SpO2 99%   BMI 19.63 kg/m  Wt Readings from Last 3 Encounters:  05/03/22 110 lb 12.8 oz (50.3 kg)  12/16/21 111 lb (50.3 kg)  10/04/21 108 lb 3.2 oz (49.1 kg)      Dulce Sellar, NP

## 2022-05-03 ENCOUNTER — Ambulatory Visit: Payer: 59 | Admitting: Family

## 2022-05-03 VITALS — BP 113/75 | HR 90 | Temp 98.0°F | Ht 63.0 in | Wt 110.8 lb

## 2022-05-03 DIAGNOSIS — M19072 Primary osteoarthritis, left ankle and foot: Secondary | ICD-10-CM | POA: Diagnosis not present

## 2022-05-03 MED ORDER — MELOXICAM 15 MG PO TABS: 7.5000 mg | ORAL_TABLET | Freq: Two times a day (BID) | ORAL | 0 refills | Status: DC | PRN

## 2022-05-03 MED ORDER — MELOXICAM 15 MG PO TABS: 7.5000 mg | ORAL_TABLET | Freq: Two times a day (BID) | ORAL | 5 refills | Status: DC | PRN

## 2022-05-03 NOTE — Patient Instructions (Addendum)
It was very nice to see you today!   You can look for generic Voltaren which is called Diclofenac sodium anti-inflammatory gel and you can apply a small amount 4 times per day.  Ok to take Turmeric Curcumin w/Black Pepper that has been approved by [employer] - outside certifying group. Take per directions on the bottle.  I have sent the Meloxicam to your pharmacy.  Let me know if the pain is not improving.      PLEASE NOTE:  If you had any lab tests please let us know if you have not heard back within a few days. You may see your results on MyChart before we have a chance to review them but we will give you a call once they are reviewed by [place]. If we ordered any referrals today, please let us know if you have not heard from their office within the next week.

## 2022-05-09 NOTE — Progress Notes (Deleted)
NEUROLOGY FOLLOW UP OFFICE NOTE  Alyssa [name] [id number]  Assessment/Plan:   Migraine with aura, without status migrainosus, not intractable Depression and anxiety   Migraine prevention:  Vypeti 300mg .  She cannot tolerate injections (significant phobia) *** Migraine rescue:  Ubrelvy or sumatriptan 20mg  NS (second line) *** Depression:  sertraline *** Limit use of pain relievers to no more than 2 days out of week to prevent risk of rebound or medication-overuse headache. Follow up ***     Subjective:  Alyssa [name] is a 57 year old right-handed female with hypothyroidism, mood disorder, and depression who follows up for migraines.   UPDATE: Increased Vyepti to 300mg .   Intensity:  Severe Duration:  up to an hour with sumatriptan NS or Ubrelvy Frequency:  7 to 8 days a month Current NSAIDS/analgesics:  Fioricet Current triptans:  sumatriptan 20mg  NS Current ergotamine:  none Current anti-emetic:  none Current muscle relaxants:  none Current Antihypertensive medications:  none Current Antidepressant medications:  amitriptyline 75mg  QHS, sertraline *** Current Anticonvulsant medications:  topiramate 50mg  BID Current anti-CGRP:  Ubrelvy 100mg , Vyepti 300mg  *** Current Antihistamines/Decongestants:  Zyrtec Other therapy:  none Hormone/birth control:  none Other medications:  none She says that her insurance will not cover Vyepti or Botox Caffeine:  no Alcohol:  no.  Only 4 times a year Smoker:  no Diet:  Drink water.  Does not skip meals.  Avoids nitrates and MSG Exercise:  not routine Depression/Anxiety:  Yes.  Significant.  Has emotional stress.  Mother-in-law has dementia.  Her mother had 2 surgeries over the past few months.  She is also concerned about her daughter.  It is affecting her migraines. Sleep hygiene:  Poor since starting Wellbutrin Currently in menopause   HISTORY:  Onset:  57 years old Location:  across forehead and either/both  temples Quality:  pounding, throbbing, squeezing Intensity:  8-9/10 Aura:  When severe, sees spots of light (particularly when eyes closed) Prodrome:  absent Associated symptoms:  Photophobia, phonophobia, osmophobia, sometimes nausea.  She denies associated unilateral numbness or weakness. Duration:  1-2 days (if treats early, knock down to 5-6/10) Frequency:  daily for past 4 months. Frequency of abortive medication: Fioricet daily for past 4 months (sometimes 2-3 times a day) Triggers:  Stress/anxiety, loud noise, caffeine, nitrates, MSG, humid/hot weather Relieving factors:  a little bit of caffeine, peppermint oil on head Activity:  aggravates   Remote CT head on 05/21/2008 was normal.     Past NSAIDS/analgesics:  ibuprofen, naproxen, acetaminophen, Excedrin Past abortive triptans: sumatriptan tab, rizatriptan, eletriptan, zolmitriptan po/NS, [place], Tosymra NS, sumatriptan [place] (cannot tolerate giving self injection) Past abortive ergotamine:  none Past anti-emetic:  Zofran, promethazine Past antihypertensive medications:  propranolol, verapamil Past antidepressant medications:  venlafaxine, Wellbutrin Past anticonvulsant medications:  Depakote Past anti-CGRP:  Aimovig, Qulipta (effective but no longer effective), Ubrelvy (effective but insurance won't cover), Nurtec Past vitamins/Herbal/Supplements:  magnesium Other past therapies:  Botox (only one injection but no longer covered)     Family history of headache:  mother (migraines)  PAST MEDICAL HISTORY: 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     cetirizine (ZYRTEC) 10 MG tablet Take 1 tablet (10 mg total) by mouth daily. 30 tablet 11   meloxicam (MOBIC) 15 MG tablet Take 0.5 tablets (7.5 mg total) by mouth 2 (two) times daily as needed for pain. 30 tablet 5   sertraline (ZOLOFT) 25 MG tablet TAKE 1 TABLET(25 MG) BY MOUTH DAILY 30 tablet 5   SUMAtriptan (IMITREX) 20 MG/ACT nasal spray 1 spray into nostril.  May repeat in 2 hours if headache persists or recurs.  Maximum 2 sprays in 24 hours (Patient not taking: Reported on 05/03/2022) 6 each 5   topiramate (TOPAMAX) 50 MG tablet take 1/2 tablet at bedtime for a week, then 1/2 tablet twice daily for a week, then increase to 1 tablet twice daily 60 tablet 0   Ubrogepant (UBRELVY) 100 MG TABS Take 100 mg by mouth as needed. May repeat after 2 hours.  Maximum 2 tablets in 24 hours. 8 tablet 5   No current facility-administered medications on file prior to visit.    ALLERGIES: Allergies  Allergen Reactions   Droperidol     extraparamodol   Ketorolac Tromethamine     extraparamadol   Codeine Nausea And Vomiting, Rash and Other (See Comments)    Extra premidol reaction    FAMILY HISTORY: Family History  Problem Relation Age of Onset   Mental retardation Neg Hx    Diabetes Neg Hx    Heart disease Neg Hx    Cancer Sister    Stroke Maternal Grandmother    Alcohol abuse Paternal Grandfather       Objective:  *** General: No acute distress.  Patient appears ***-groomed.   Head:  Normocephalic/atraumatic Eyes:  Fundi examined but not visualized Neck: supple, no paraspinal tenderness, full range of motion Heart:  Regular rate and rhythm Lungs:  Clear to auscultation bilaterally Back: No paraspinal tenderness Neurological Exam: alert and oriented to person,  place, and time.  Speech fluent and not dysarthric, language intact.  CN II-XII intact. Bulk and tone normal, muscle strength 5/5 throughout.  Sensation to light touch intact.  Deep tendon reflexes 2+ throughout, toes downgoing.  Finger to nose testing intact.  Gait normal, Romberg negative.   Shon Millet, DO  CC: ***

## 2022-05-10 ENCOUNTER — Ambulatory Visit: Payer: 59 | Admitting: Neurology

## 2022-05-28 ENCOUNTER — Other Ambulatory Visit: Payer: Self-pay | Admitting: Neurology

## 2022-06-13 ENCOUNTER — Other Ambulatory Visit (HOSPITAL_COMMUNITY): Payer: Self-pay

## 2022-06-13 ENCOUNTER — Telehealth: Payer: Self-pay | Admitting: Pharmacy Technician

## 2022-06-13 NOTE — Telephone Encounter (Signed)
Patient Advocate Encounter  Received notification from [employer] that prior authorization for UBRELVY 100MG  is required.   PA submitted on 6.10.24 Key [id number] Status is pending

## 2022-07-24 ENCOUNTER — Other Ambulatory Visit: Payer: Self-pay | Admitting: Neurology

## 2022-07-27 ENCOUNTER — Encounter: Payer: Self-pay | Admitting: Family

## 2022-07-27 ENCOUNTER — Ambulatory Visit: Payer: 59 | Admitting: Family

## 2022-07-27 VITALS — BP 110/84 | HR 89 | Temp 98.2°F | Ht 63.0 in | Wt 110.6 lb

## 2022-07-27 DIAGNOSIS — K5909 Other constipation: Secondary | ICD-10-CM

## 2022-07-27 DIAGNOSIS — Z1211 Encounter for screening for malignant neoplasm of colon: Secondary | ICD-10-CM

## 2022-07-27 DIAGNOSIS — M19072 Primary osteoarthritis, left ankle and foot: Secondary | ICD-10-CM

## 2022-07-27 NOTE — Patient Instructions (Addendum)
It was very nice to see you today!   I have sent a referral to the podiatry office, they should call you to schedule. You can try 1-2 tabs of Naproxen twice a day for your foot pain until seen.  Ok to take this with the Tylenol arthritis or take in between. Apply ice as able to top and bottom of foot up to 20 minutes 3 times per day.  For your constipation you can take Magnesium oxide, or glycinate, or taurate, over the counter. Start with 1 pill twice a day and then increase as needed to have at least one soft BM daily. Remember to drink 8 cups of water daily to help get water into your colon. Continue eating the fiber rich foods.  Can also add a generic stool softener if needed. I also have sent the order for a Cologuard screening.      PLEASE NOTE:  If you had any lab tests please let us know if you have not heard back within a few days. You may see your results on MyChart before we have a chance to review them but we will give you a call once they are reviewed by [place]. If we ordered any referrals today, please let us know if you have not heard from their office within the next week.

## 2022-07-27 NOTE — Assessment & Plan Note (Signed)
chronic - started years ago left forefoot and ball of foot, into big toe Mobic did not help, voltaren gel helps some denies neuropathy has not had any imaging or ortho eval sending referral to podiatry advised on icing foot up to tid, continue Tylenol arthritis and can also try 1-2 Naproxen bid f/u prn

## 2022-07-27 NOTE — Progress Notes (Signed)
Patient ID: Alyssa [name], female    DOB: 02/27/1965, 57 y.o.   MRN: [id number]  Chief Complaint  Patient presents with   foot pain    Pt stated that she has had Lt foot pain since she was last seen in 4/24 and it has gotten worse    HPI: Foot pain:   has known arthritis of left great toe & the pain is now moving down her foot and over last few days has been worsened. She is on her feet a lot during the day and wears shoes appropriate for her arch and are comfortable most of the time. *Given Mobic last visit which did help at all. Feels a lot of pain at ball of foot and top of foot around the big toe. States she gets some relief with Tylenol arthritis and OTC voltaren gel. She is on her feet all day taking care of her.   Constipation:  reports having more stomach bloating, pain, & constipation. Has a BM about 2-3d/week. Has increased fiber in her diet which did not help. Took liquid magnesium citrate OTC recently which did work.  Assessment & Plan:  Primary osteoarthritis of left foot Assessment & Plan: chronic - started years ago left forefoot and ball of foot, into big toe Mobic did not help, voltaren gel helps some denies neuropathy has not had any imaging or ortho eval sending referral to podiatry advised on icing foot up to tid, continue Tylenol arthritis and can also try 1-2 Naproxen bid f/u prn  Orders: -     Ambulatory referral to Podiatry   Other constipation- advised to continue fiber diet, drink at least 2L water daily, look for OTC Magnesium oxide, glycinate or taurate, start with 1 pill twice and titrate slowly up per instructions on bottle, adjust as needed based on frequency/consistency of stools. RTO if no improvement.  Screening for colon cancer -     Cologuard   Subjective:    Outpatient Medications Prior to Visit  Medication Sig Dispense Refill   amitriptyline (ELAVIL) 75 MG tablet TAKE 1 TABLET(75 MG) BY MOUTH AT BEDTIME 90 tablet 1   cetirizine  (ZYRTEC) 10 MG tablet Take 1 tablet (10 mg total) by mouth daily. 30 tablet 11   sertraline (ZOLOFT) 25 MG tablet TAKE 1 TABLET(25 MG) BY MOUTH DAILY 30 tablet 5   SUMAtriptan (IMITREX) 20 MG/ACT nasal spray 1 spray into nostril.  May repeat in 2 hours if headache persists or recurs.  Maximum 2 sprays in 24 hours 6 each 5   topiramate (TOPAMAX) 50 MG tablet take 1/2 tablet at bedtime for a week, then 1/2 tablet twice daily for a week, then increase to 1 tablet twice daily 60 tablet 0   Ubrogepant (UBRELVY) 100 MG TABS TAKE 1 TABLET BY MOUTH AS NEEDED. MAY REPEAT AFTER 2 HOURS. MAXIMUM 2 TABLETS IN 24 HOURS 8 tablet 1   meloxicam (MOBIC) 15 MG tablet Take 0.5 tablets (7.5 mg total) by mouth 2 (two) times daily as needed for pain. (Patient not taking: Reported on 07/27/2022) 30 tablet 5   No facility-administered medications prior to visit.   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   Anemia remote past   nutritional; slow Fe   Bipolar disorder (HCC)    Depression    Fainting spell    Frequent headaches    Gestational diabetes mellitus, antepartum    Hypothyroidism  Migraine syndrome    Mood disorder (HCC) 10/10/2012   Normal labor 08/10/2013   Status post vacuum-assisted vaginal delivery 08/11/2013   Syncope 10/10/2012   Past Surgical History:  Procedure Laterality Date   NO PAST SURGERIES     none     Allergies  Allergen Reactions   Droperidol     extraparamodol   Ketorolac Tromethamine     extraparamadol   Codeine Nausea And Vomiting, Rash and Other (See Comments)    Extra premidol reaction      Objective:    Physical Exam Vitals and nursing note reviewed.  Constitutional:      Appearance: Normal appearance.  Cardiovascular:     Rate and Rhythm: Normal rate and regular rhythm.  Pulmonary:     Effort: Pulmonary effort is normal.     Breath sounds: Normal breath sounds.  Musculoskeletal:      Left foot: Decreased range of motion. No deformity (no swelling or erythema) or bunion.  Feet:     Left foot:     Toenail Condition: Left toenails are normal.  Skin:    General: Skin is warm and dry.  Neurological:     Mental Status: She is alert.  Psychiatric:        Mood and Affect: Mood normal.        Behavior: Behavior normal.    BP 110/84   Pulse 89   Temp 98.2 F (36.8 C)   Ht 5\' 3"  (1.6 m)   Wt 110 lb 9.6 oz (50.2 kg)   SpO2 99%   BMI 19.59 kg/m  Wt Readings from Last 3 Encounters:  07/27/22 110 lb 9.6 oz (50.2 kg)  05/03/22 110 lb 12.8 oz (50.3 kg)  12/16/21 111 lb (50.3 kg)      Dulce Sellar, NP

## 2022-07-28 ENCOUNTER — Other Ambulatory Visit: Payer: Self-pay | Admitting: Neurology

## 2022-07-28 ENCOUNTER — Encounter: Payer: Self-pay | Admitting: Neurology

## 2022-08-09 ENCOUNTER — Ambulatory Visit (INDEPENDENT_AMBULATORY_CARE_PROVIDER_SITE_OTHER): Payer: 59

## 2022-08-09 ENCOUNTER — Ambulatory Visit: Payer: 59 | Admitting: Podiatry

## 2022-08-09 VITALS — BP 127/68 | HR 91

## 2022-08-09 DIAGNOSIS — M79672 Pain in left foot: Secondary | ICD-10-CM | POA: Diagnosis not present

## 2022-08-09 DIAGNOSIS — G8929 Other chronic pain: Secondary | ICD-10-CM

## 2022-08-09 DIAGNOSIS — M7752 Other enthesopathy of left foot: Secondary | ICD-10-CM

## 2022-08-09 DIAGNOSIS — M205X2 Other deformities of toe(s) (acquired), left foot: Secondary | ICD-10-CM

## 2022-08-09 MED ORDER — METHYLPREDNISOLONE 4 MG PO TBPK: ORAL_TABLET | ORAL | 0 refills | Status: DC

## 2022-08-09 NOTE — Progress Notes (Unsigned)
Chief Complaint  Patient presents with   Osteoarthritis    Left foot pain x5 years, Hx of arthritis in left big toe joint. Patient states she is constantly on her feet daily.     HPI: 57 y.o. female presents today with concern of pain and stiffness in the left great toe joint.  She is taking Tylenol arthritis and applying pain patches to the great toe joint area.  Denies injury.  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   Anemia remote past   nutritional; slow Fe   Bipolar disorder (HCC)    Depression    Fainting spell    Frequent headaches    Gestational diabetes mellitus, antepartum    Hypothyroidism    Migraine syndrome    Mood disorder (HCC) 10/10/2012   Normal labor 08/10/2013   Status post vacuum-assisted vaginal delivery 08/11/2013   Syncope 10/10/2012   Past Surgical History:  Procedure Laterality Date   NO PAST SURGERIES     none     Allergies  Allergen Reactions   Droperidol     extraparamodol   Ketorolac Tromethamine     extraparamadol   Codeine Nausea And Vomiting, Rash and Other (See Comments)    Extra premidol reaction    Review of Systems  Musculoskeletal:  Positive for joint pain.     Physical Exam: Vitals:   08/09/22 0928  BP: 127/68  Pulse: 91   General: The patient is alert and oriented x3 in no acute distress.  Dermatology: Skin is warm, dry and supple bilateral lower extremities. Interspaces are clear of maceration and debris.    Vascular: Palpable pedal pulses bilaterally. Capillary refill within normal limits.  No appreciable edema.  No erythema or calor.  Neurological: Light touch sensation grossly intact bilateral feet.   Musculoskeletal Exam: Pain on palpation dorsal aspect first metatarsophalangeal joint left foot.  Decreased range of motion left first  metatarsophalangeal joint without crepitus.  Pain at end range of motion left first MPJ.  Radiographic Exam (left foot, 3 weightbearing views, 08/09/2022):  Normal osseous mineralization.  There is joint space narrowing at the first metatarsophalangeal joint.  There is a dorsal spur noted at the head of the first metatarsal seen on lateral view.  Metatarsal length is within normal range.  Assessment/Plan of Care: 1. Hallux limitus, acquired, left   2. Chronic pain in left foot   3. Bone spur of left foot      Meds ordered this encounter  Medications   methylPREDNISolone (MEDROL DOSEPAK) 4 MG TBPK tablet    Sig: Take as directed    Dispense:  21 tablet    Refill:  0   Discussed clinical and radiographic findings with patient today.  Discussed conservative measures, including Voltaren gel, continuing with Tylenol arthritis, prescription for Medrol Dosepak which was sent in today, wearing of stiff soled shoes or purchasing a thin carbon plate or steel plate from [employer] to make any of her flexible shoes more of a stiff soled shoe.  Will hold off on any cortisone injections today since she is not overly symptomatic at this time.  Briefly discussed surgical intervention with associated risks but she is not ready to proceed with this at this time.  Follow-up as needed  Clerance Lav, DPM, FACFAS [facility]     [address], [place] [address]                Office [phone]  Fax [phone]

## 2022-08-11 DIAGNOSIS — M7752 Other enthesopathy of left foot: Secondary | ICD-10-CM | POA: Insufficient documentation

## 2022-08-11 DIAGNOSIS — M205X2 Other deformities of toe(s) (acquired), left foot: Secondary | ICD-10-CM | POA: Insufficient documentation

## 2022-08-22 ENCOUNTER — Telehealth: Payer: Self-pay | Admitting: Neurology

## 2022-08-22 NOTE — Telephone Encounter (Signed)
Patient wants to speak to someone about maybe increasing the dosage on the sertraline or changing to a different medication  She uses the [facility] on [address]

## 2022-08-23 MED ORDER — SERTRALINE HCL 25 MG PO TABS: 50.0000 mg | ORAL_TABLET | Freq: Every day | ORAL | 5 refills | Status: DC

## 2022-08-23 NOTE — Telephone Encounter (Signed)
f she is speaking about experiencing increased depression and/or anxiety, we can increase sertraline from 25mg  to 50mg  daily.  Please send in new prescription if she is agreeable     Patient agrees, Increase sent to the pharmacy.

## 2022-09-08 ENCOUNTER — Telehealth: Payer: Self-pay | Admitting: Neurology

## 2022-09-08 NOTE — Telephone Encounter (Signed)
Patient called needing Ubrelvy 100mg  samples

## 2022-09-09 NOTE — Telephone Encounter (Signed)
Pt called in requesting to speak with Alyssa [name] today about her samples she received today

## 2022-09-09 NOTE — Telephone Encounter (Signed)
Patient advised okay to come pick up samples.

## 2022-09-15 ENCOUNTER — Other Ambulatory Visit (HOSPITAL_COMMUNITY): Payer: Self-pay

## 2022-09-15 NOTE — Progress Notes (Signed)
cologuard negative, recheck in 3 years. Thx

## 2022-09-15 NOTE — Telephone Encounter (Signed)
LMOVM for patient, Approval

## 2022-09-15 NOTE — Telephone Encounter (Signed)
Pharmacy Patient Advocate Encounter  Received notification from [facility] that Prior Authorization for Ubrelvy 100MG  tablets  has been APPROVED from 06/13/2022 to 06/13/2023. Ran test claim, Copay is $refill too soon . This test claim was processed through [facility]- copay amounts may vary at other pharmacies due to pharmacy/plan contracts, or as the patient moves through the different stages of their insurance plan.

## 2022-10-06 ENCOUNTER — Encounter: Payer: Self-pay | Admitting: Neurology

## 2022-10-22 ENCOUNTER — Other Ambulatory Visit: Payer: Self-pay | Admitting: Neurology

## 2022-10-26 ENCOUNTER — Other Ambulatory Visit: Payer: Self-pay | Admitting: Neurology

## 2022-10-26 ENCOUNTER — Telehealth: Payer: Self-pay | Admitting: Neurology

## 2022-10-26 MED ORDER — UBRELVY 100 MG PO TABS: 1.0000 | ORAL_TABLET | ORAL | 0 refills | Status: DC | PRN

## 2022-10-26 NOTE — Progress Notes (Unsigned)
NEUROLOGY FOLLOW UP OFFICE NOTE  Alyssa [name] [id number]  Assessment/Plan:   Migraine with aura, without status migrainosus, not intractable Depression and anxiety   She will receive a migraine cocktail today.  She has a driver. Migraine prevention:  restart topiramate and titrate to 50mg  twice daily Migraine rescue:  [name].  Will have her try samples of Trudhesa NS as second line. To address depression and anxiety:  sertraline 50mg  daily Headache diary Limit use of pain relievers to no more than 2 days out of week to prevent risk of rebound or medication-overuse headache. Follow up 5 months     Subjective:  Alyssa [name] is a 57 year old right-handed female with hypothyroidism, mood disorder, and depression who follows up for migraines.   UPDATE: Last seen in December 2023.  Increased Vyepti to 300mg .  Stopped Vypeti after January.  She requested to restart topiramate in March.  She said she was on it for a little while but she hasn't been on it for several months.  Uncertain why it was stopped.   Transitioned from duloxetine to sertraline to better treat depression and anxiety.  Due to increased depression and anxiety, sertraline was increased to 50mg  daily.  Her stepfather passed away 10 days ago.  She has had more intractable migraines since then.  Intensity:  Severe Duration:  up to an hour with [name] Frequency:  7 to 8 days a month Current NSAIDS/analgesics:  none Current triptans:  none Current ergotamine:  none Current anti-emetic:  none Current muscle relaxants:  none Current Antihypertensive medications:  none Current Antidepressant medications:  amitriptyline 75mg  QHS, duloxetine 60mg  daily Current Anticonvulsant medications:  topiramate 50mg  BID Current anti-CGRP:  Ubrelvy 100mg  Current Antihistamines/Decongestants:  Zyrtec Other therapy:  none Hormone/birth control:  none Other medications:  none She says that her insurance will not cover Vyepti  or Botox Caffeine:  no Alcohol:  no.  Only 4 times a year Smoker:  no Diet:  Drink water.  Does not skip meals.  Avoids nitrates and MSG Exercise:  not routine Depression/Anxiety:  Yes.  Significant.  Has emotional stress.  Mother-in-law has dementia.  Her mother had 2 surgeries over the past few months.  She is also concerned about her daughter.  It is affecting her migraines. Sleep hygiene:  Poor since starting Wellbutrin Currently in menopause   HISTORY:  Onset:  57 years old Location:  across forehead and either/both temples Quality:  pounding, throbbing, squeezing Intensity:  8-9/10 Aura:  When severe, sees spots of light (particularly when eyes closed) Prodrome:  absent Associated symptoms:  Photophobia, phonophobia, osmophobia, sometimes nausea.  She denies associated unilateral numbness or weakness. Duration:  1-2 days (if treats early, knock down to 5-6/10) Frequency:  daily for past 4 months. Frequency of abortive medication: Fioricet daily for past 4 months (sometimes 2-3 times a day) Triggers:  Stress/anxiety, loud noise, caffeine, nitrates, MSG, humid/hot weather Relieving factors:  a little bit of caffeine, peppermint oil on head Activity:  aggravates   Remote CT head on 05/21/2008 was normal.     Past NSAIDS/analgesics:  ibuprofen, naproxen, acetaminophen, Excedrin, Fioricet Past abortive triptans: sumatriptan tab, rizatriptan, eletriptan, zolmitriptan po/NS, Amerge, Tosymra NS, sumatriptan [place] (cannot tolerate giving self injection), sumatriptan 20mg  NS Past abortive ergotamine:  none Past anti-emetic:  Zofran, promethazine Past antihypertensive medications:  propranolol, verapamil Past antidepressant medications:  venlafaxine, Wellbutrin Past anticonvulsant medications:  Depakote Past anti-CGRP:  Aimovig, Qulipta (effective but no longer effective), Ubrelvy (effective but insurance  won't cover), Nurtec, Vyepti (cannot afford) Past vitamins/Herbal/Supplements:   magnesium Other past therapies:  Botox (only one injection but no longer covered)     Family history of headache:  mother (migraines)  PAST MEDICAL HISTORY: 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   Anemia remote past   nutritional; slow Fe   Bipolar disorder (HCC)    Depression    Fainting spell    Frequent headaches    Gestational diabetes mellitus, antepartum    Hypothyroidism    Migraine syndrome    Mood disorder (HCC) 10/10/2012   Normal labor 08/10/2013   Status post vacuum-assisted vaginal delivery 08/11/2013   Syncope 10/10/2012    MEDICATIONS: Current Outpatient Medications on File Prior to Visit  Medication Sig Dispense Refill   amitriptyline (ELAVIL) 75 MG tablet TAKE 1 TABLET(75 MG) BY MOUTH AT BEDTIME 90 tablet 1   cetirizine (ZYRTEC) 10 MG tablet Take 1 tablet (10 mg total) by mouth daily. 30 tablet 11   meloxicam (MOBIC) 15 MG tablet Take 0.5 tablets (7.5 mg total) by mouth 2 (two) times daily as needed for pain. (Patient not taking: Reported on 07/27/2022) 30 tablet 5   methylPREDNISolone (MEDROL DOSEPAK) 4 MG TBPK tablet Take as directed 21 tablet 0   sertraline (ZOLOFT) 25 MG tablet Take 2 tablets (50 mg total) by mouth daily. 60 tablet 5   SUMAtriptan (IMITREX) 20 MG/ACT nasal spray 1 spray into nostril.  May repeat in 2 hours if headache persists or recurs.  Maximum 2 sprays in 24 hours (Patient not taking: Reported on 08/09/2022) 6 each 5   topiramate (TOPAMAX) 50 MG tablet take 1/2 tablet at bedtime for a week, then 1/2 tablet twice daily for a week, then increase to 1 tablet twice daily (Patient not taking: Reported on 08/09/2022) 60 tablet 0   UBRELVY 100 MG TABS TAKE 1 TABLET BY MOUTH AS NEEDED. MAY REPEAT AFTER 2 HOURS. MAX 2 TABLETS IN 24 HOURS 8 tablet 1   No current facility-administered medications on file prior to visit.    ALLERGIES: Allergies  Allergen Reactions    Droperidol     extraparamodol   Ketorolac Tromethamine     extraparamadol   Codeine Nausea And Vomiting, Rash and Other (See Comments)    Extra premidol reaction    FAMILY HISTORY: Family History  Problem Relation Age of Onset   Mental retardation Neg Hx    Diabetes Neg Hx    Heart disease Neg Hx    Cancer Sister    Stroke Maternal Grandmother    Alcohol abuse Paternal Grandfather       Objective:  Blood pressure 138/80, pulse 85, height 5' (1.524 m), weight 116 lb (52.6 kg), SpO2 99%. General: No acute distress.  Patient appears well-groomed.   Head:  Normocephalic/atraumatic Neck:  Supple.  No paraspinal tenderness.  Full range of motion. Heart:  Regular rate and rhythm. Neuro:  Alert and oriented.  Speech fluent and not dysarthric.  Language intact.  CN II-XII intact.  Bulk and tone normal.  Muscle strength 5/5 throughout.  Deep tendon reflexes 2+ throughout.  Gait normal.  Romberg negative.    Shon Millet, DO  CC: Alyssa Sellar, NP

## 2022-10-26 NOTE — Telephone Encounter (Signed)
Pt called in stating she has had a death in the family and really needs her Ubrelvy. She has been without it. She is having migraines and is adamant she needs the prescription today. She scheduled a follow up for 10/27/22.

## 2022-10-26 NOTE — Telephone Encounter (Signed)
Called patient and left voicemail that refill has been sent in

## 2022-10-26 NOTE — Telephone Encounter (Signed)
Pt requests a call back. States she has more to say, but didn't have time to tell me.

## 2022-10-27 ENCOUNTER — Encounter: Payer: Self-pay | Admitting: Neurology

## 2022-10-27 ENCOUNTER — Ambulatory Visit: Payer: 59 | Admitting: Neurology

## 2022-10-27 ENCOUNTER — Other Ambulatory Visit: Payer: Self-pay | Admitting: Neurology

## 2022-10-27 VITALS — BP 138/80 | HR 85 | Ht 60.0 in | Wt 116.0 lb

## 2022-10-27 DIAGNOSIS — G43109 Migraine with aura, not intractable, without status migrainosus: Secondary | ICD-10-CM | POA: Diagnosis not present

## 2022-10-27 DIAGNOSIS — F32A Depression, unspecified: Secondary | ICD-10-CM | POA: Diagnosis not present

## 2022-10-27 DIAGNOSIS — F419 Anxiety disorder, unspecified: Secondary | ICD-10-CM | POA: Diagnosis not present

## 2022-10-27 MED ORDER — KETOROLAC TROMETHAMINE 60 MG/2ML IM SOLN
60.0000 mg | Freq: Once | INTRAMUSCULAR | Status: AC
Start: 2022-10-27 — End: 2022-10-27
  Administered 2022-10-27: 60 mg via INTRAMUSCULAR

## 2022-10-27 MED ORDER — METOCLOPRAMIDE HCL 5 MG/ML IJ SOLN
10.0000 mg | Freq: Once | INTRAMUSCULAR | Status: AC
  Administered 2022-10-27: 10 mg via INTRAMUSCULAR

## 2022-10-27 MED ORDER — TOPIRAMATE 50 MG PO TABS: ORAL_TABLET | ORAL | 0 refills | Status: DC

## 2022-10-27 MED ORDER — UBRELVY 100 MG PO TABS: 1.0000 | ORAL_TABLET | ORAL | 11 refills | Status: DC | PRN

## 2022-10-27 MED ORDER — DIPHENHYDRAMINE HCL 50 MG/ML IJ SOLN
50.0000 mg | Freq: Once | INTRAMUSCULAR | Status: AC
  Administered 2022-10-27: 50 mg via INTRAMUSCULAR

## 2022-10-27 NOTE — Progress Notes (Signed)
Advised patient of medication in headache cocktail. Toradol,Reglan, Benadryl.  Per  patient she can have all three medications

## 2022-10-27 NOTE — Addendum Note (Signed)
Addended by: Leida Lauth on: 10/27/2022 11:16 AM   Modules accepted: Orders

## 2022-10-27 NOTE — Patient Instructions (Addendum)
Restart topiramate as directed. Ubrelvy as needed.  As back up, try [place].  Prime 4 pumps, then 1 spray in east nostril.  May repeat once with new device in 1 hour Follow up 5 months    Ask your insurance about the following medications: Ascencion Dike Emgality Vyepti Ask again about Botox too.

## 2022-11-14 ENCOUNTER — Ambulatory Visit: Payer: 59 | Admitting: Podiatry

## 2022-11-23 ENCOUNTER — Other Ambulatory Visit: Payer: Self-pay | Admitting: Neurology

## 2023-02-08 ENCOUNTER — Telehealth: Payer: Self-pay | Admitting: Neurology

## 2023-02-08 NOTE — Telephone Encounter (Signed)
 Would like to pick up samples of Ubrelvy  100 mg tablets today. It's too early to get it refilled. She has had a migraine for 4 days now. She doesn't have a driver to get a headache cocktail.

## 2023-02-08 NOTE — Telephone Encounter (Signed)
 She can pick up samples and if she would like, please send prednisone taper.  Patient stop by the office tomorrow for sample of ubrelvy 

## 2023-03-20 ENCOUNTER — Telehealth: Payer: Self-pay | Admitting: Neurology

## 2023-03-20 ENCOUNTER — Other Ambulatory Visit: Payer: Self-pay | Admitting: Neurology

## 2023-03-20 NOTE — Telephone Encounter (Signed)
 Patient has appt on 03-23-23 with Dr Everlena Cooper she states that she has a lot on her plate with her Daughter and now helping take carte of her mother since the passing of her mother's husband. She states that the Sertaline  is not working and she states that it is like drinking water. She states that topamax and the [name] does work for her but she needs something to help her.    She said you could call her to get more information from her about what is going

## 2023-03-20 NOTE — Telephone Encounter (Signed)
 LMOVM for patient to call the office back.

## 2023-03-20 NOTE — Telephone Encounter (Signed)
 Pt called to give jaffe some information but she had another call. I held for a while but had to hang up. She will call back

## 2023-03-21 NOTE — Telephone Encounter (Signed)
 PER patient Alyssa [name] not doing to well right now. More stress with her child.    The sertraline not helping at all. Patient has an appointment on 03/23/23.  Patient want to know if DR.Everlena Cooper will prescribed hormone replacement.   If Alyssa [name] could increase her Amitriptyline to help in for sleep.

## 2023-03-22 NOTE — Progress Notes (Unsigned)
 NEUROLOGY FOLLOW UP OFFICE NOTE  Alyssa [name] [id number]  Assessment/Plan:   Migraine with aura, without status migrainosus, not intractable Depression and anxiety   Migraine prevention:  topiramate 50mg  twice daily Migraine rescue:  [name].  Will have her try samples of Trudhesa NS as second line. To address depression and anxiety:  sertraline 50mg  daily Headache diary Limit use of pain relievers to no more than 2 days out of week to prevent risk of rebound or medication-overuse headache. Follow up 6 months ***     Subjective:  Alyssa [name] is a 58 year old right-handed female with hypothyroidism, mood disorder, and depression who follows up for migraines.   UPDATE: Restarted topiramate Trudhesa second line?   She has continued to have significant stress with her daughter who has autism and now caring for her mother since her mother's husband passed away.  Sertraline is ineffective.    Intensity:  Severe Duration:  up to an hour with [name] Frequency:  7 to 8 days a month Current NSAIDS/analgesics:  none Current triptans:  none Current ergotamine:  Trudhesa NS (samples) Current anti-emetic:  none Current muscle relaxants:  none Current Antihypertensive medications:  none Current Antidepressant medications:  amitriptyline 75mg  QHS, sertraline 50mg  daily Current Anticonvulsant medications:  topiramate 50mg  BID Current anti-CGRP:  Ubrelvy 100mg  Current Antihistamines/Decongestants:  Zyrtec Other therapy:  none Hormone/birth control:  none Other medications:  none She says that her insurance will not cover Vyepti or Botox Caffeine:  no Alcohol:  no.  Only 4 times a year Smoker:  no Diet:  Drink water.  Does not skip meals.  Avoids nitrates and MSG Exercise:  not routine Depression/Anxiety:  Yes.  Significant.  Has emotional stress.  Mother-in-law has dementia.  Her mother had 2 surgeries over the past few months.  She is also concerned about her daughter.  It  is affecting her migraines. Sleep hygiene:  Poor since starting Wellbutrin Currently in menopause   HISTORY:  Onset:  58 years old Location:  across forehead and either/both temples Quality:  pounding, throbbing, squeezing Intensity:  8-9/10 Aura:  When severe, sees spots of light (particularly when eyes closed) Prodrome:  absent Associated symptoms:  Photophobia, phonophobia, osmophobia, sometimes nausea.  She denies associated unilateral numbness or weakness. Duration:  1-2 days (if treats early, knock down to 5-6/10) Frequency:  daily for past 4 months. Frequency of abortive medication: Fioricet daily for past 4 months (sometimes 2-3 times a day) Triggers:  Stress/anxiety, loud noise, caffeine, nitrates, MSG, humid/hot weather Relieving factors:  a little bit of caffeine, peppermint oil on head Activity:  aggravates   Remote CT head on 05/21/2008 was normal.     Past NSAIDS/analgesics:  ibuprofen, naproxen, acetaminophen, Excedrin, Fioricet Past abortive triptans: sumatriptan tab, rizatriptan, eletriptan, zolmitriptan po/NS, Amerge, Tosymra NS, sumatriptan [place] (cannot tolerate giving self injection), sumatriptan 20mg  NS Past abortive ergotamine:  none Past anti-emetic:  Zofran, promethazine Past antihypertensive medications:  propranolol, verapamil Past antidepressant medications:  venlafaxine, duloxetine, Wellbutrin Past anticonvulsant medications:  Depakote Past anti-CGRP:  Aimovig, Qulipta (effective but no longer effective), Ubrelvy (effective but insurance won't cover), Nurtec, Vyepti (cannot afford) Past vitamins/Herbal/Supplements:  magnesium Other past therapies:  Botox (only one injection but no longer covered)     Family history of headache:  mother (migraines)  PAST MEDICAL HISTORY: 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   Anemia remote past   nutritional; slow Fe  Bipolar  disorder (HCC)    Depression    Fainting spell    Frequent headaches    Gestational diabetes mellitus, antepartum    Hypothyroidism    Migraine syndrome    Mood disorder (HCC) 10/10/2012   Normal labor 08/10/2013   Status post vacuum-assisted vaginal delivery 08/11/2013   Syncope 10/10/2012    MEDICATIONS: Current Outpatient Medications on File Prior to Visit  Medication Sig Dispense Refill   amitriptyline (ELAVIL) 75 MG tablet TAKE 1 TABLET(75 MG) BY MOUTH AT BEDTIME 90 tablet 1   cetirizine (ZYRTEC) 10 MG tablet Take 1 tablet (10 mg total) by mouth daily. 30 tablet 11   sertraline (ZOLOFT) 25 MG tablet Take 2 tablets (50 mg total) by mouth daily. 60 tablet 5   topiramate (TOPAMAX) 50 MG tablet 1 TABLET TWICE DAILY 60 tablet 5   Ubrogepant (UBRELVY) 100 MG TABS Take 1 tablet (100 mg total) by mouth as needed. May repeat after 2 hours.  Maximum 2 tablets in 24 hours. 8 tablet 11   No current facility-administered medications on file prior to visit.    ALLERGIES: Allergies  Allergen Reactions   Droperidol     extraparamodol   Ketorolac Tromethamine     extraparamadol   Codeine Nausea And Vomiting, Rash and Other (See Comments)    Extra premidol reaction    FAMILY HISTORY: Family History  Problem Relation Age of Onset   Mental retardation Neg Hx    Diabetes Neg Hx    Heart disease Neg Hx    Cancer Sister    Stroke Maternal Grandmother    Alcohol abuse Paternal Grandfather       Objective:  *** General: No acute distress.  Patient appears well-groomed.   ***    Shon Millet, DO  CC: Dulce Sellar, NP

## 2023-03-23 ENCOUNTER — Ambulatory Visit (INDEPENDENT_AMBULATORY_CARE_PROVIDER_SITE_OTHER): Admitting: Neurology

## 2023-03-23 ENCOUNTER — Encounter: Payer: Self-pay | Admitting: Neurology

## 2023-03-23 VITALS — BP 113/79 | HR 92 | Ht 61.0 in | Wt 113.6 lb

## 2023-03-23 DIAGNOSIS — F419 Anxiety disorder, unspecified: Secondary | ICD-10-CM

## 2023-03-23 DIAGNOSIS — G43109 Migraine with aura, not intractable, without status migrainosus: Secondary | ICD-10-CM

## 2023-03-23 DIAGNOSIS — F32A Depression, unspecified: Secondary | ICD-10-CM

## 2023-03-23 MED ORDER — TOPIRAMATE 50 MG PO TABS: ORAL_TABLET | ORAL | 0 refills | Status: DC

## 2023-03-23 NOTE — Patient Instructions (Signed)
 RESTART TOPIRAMATE - INCREASE DOSE AS INSTRUCTED.  WHEN YOU NEED A REFILL, CONTACT ME CONTINUE SERTRALINE 50MG  DAILY FOR NOW.  WHEN YOU NEED A REFILL, CONTACT ME AND WE CAN INCREASE DOSE UBRELVY AS NEEDED FOLLOW UP 6 MONTHS.

## 2023-03-27 ENCOUNTER — Ambulatory Visit: Payer: 59 | Admitting: Neurology

## 2023-04-07 ENCOUNTER — Other Ambulatory Visit: Payer: Self-pay | Admitting: Neurology

## 2023-04-28 ENCOUNTER — Other Ambulatory Visit: Payer: Self-pay | Admitting: Neurology

## 2023-05-17 ENCOUNTER — Other Ambulatory Visit (HOSPITAL_COMMUNITY): Payer: Self-pay

## 2023-05-17 ENCOUNTER — Telehealth: Payer: Self-pay | Admitting: Pharmacist

## 2023-05-17 NOTE — Telephone Encounter (Signed)
 Pharmacy Patient Advocate Encounter   Received notification from CoverMyMeds that prior authorization for Ubrelvy  100MG  tablets is required/requested.   Insurance verification completed.   The patient is insured through [employer] .   Per test claim: PA not needed at this time

## 2023-05-18 ENCOUNTER — Other Ambulatory Visit: Payer: Self-pay | Admitting: Neurology

## 2023-05-18 MED ORDER — SERTRALINE HCL 50 MG PO TABS: 100.0000 mg | ORAL_TABLET | Freq: Every day | ORAL | 5 refills | Status: DC

## 2023-06-01 ENCOUNTER — Other Ambulatory Visit: Payer: Self-pay | Admitting: Neurology

## 2023-06-09 ENCOUNTER — Telehealth: Payer: Self-pay | Admitting: Neurology

## 2023-06-09 NOTE — Telephone Encounter (Signed)
 Patient called and states that she needs to come in next week and pick up some samples of the Ubrelvy  100 mg

## 2023-06-09 NOTE — Telephone Encounter (Signed)
 Medication Samples have been provided to the patient.  Drug name: Ubrelvy        Strength: 100 mg        Qty: 4   LOT: [id number]  Exp.Date: 12/26  Dosing instructions: as needed  The patient has been instructed regarding the correct time, dose, and frequency of taking this medication, including desired effects and most common side effects.   Alyssa [name] 4:18 PM 06/09/2023

## 2023-06-15 ENCOUNTER — Other Ambulatory Visit (HOSPITAL_COMMUNITY): Payer: Self-pay

## 2023-06-15 ENCOUNTER — Telehealth: Payer: Self-pay | Admitting: Pharmacist

## 2023-06-15 NOTE — Telephone Encounter (Signed)
 Pharmacy Patient Advocate Encounter   Received notification from CoverMyMeds that prior authorization for Ubrelvy  100MG  tablets is required/requested.   Insurance verification completed.   The patient is insured through [facility] .   Per test claim: PA required; PA submitted to above mentioned insurance via CoverMyMeds Key/confirmation #/EOC [id number] Status is pending

## 2023-06-16 ENCOUNTER — Other Ambulatory Visit (HOSPITAL_COMMUNITY): Payer: Self-pay

## 2023-06-28 ENCOUNTER — Other Ambulatory Visit (HOSPITAL_COMMUNITY): Payer: Self-pay

## 2023-06-29 ENCOUNTER — Other Ambulatory Visit (HOSPITAL_COMMUNITY): Payer: Self-pay

## 2023-07-17 ENCOUNTER — Other Ambulatory Visit (HOSPITAL_COMMUNITY): Payer: Self-pay

## 2023-07-18 ENCOUNTER — Telehealth: Payer: Self-pay | Admitting: Pharmacy Technician

## 2023-07-18 NOTE — Telephone Encounter (Deleted)
 Pharmacy Patient Advocate Encounter   Received notification from CoverMyMeds that prior authorization for UBRELVY  100MG  is required/requested.   Insurance verification completed.   The patient is insured through [facility] .   Per test claim: PA required; PA submitted to above mentioned insurance via CoverMyMeds Key/confirmation #/EOC [id number] Status is pending

## 2023-07-26 NOTE — Telephone Encounter (Signed)
 Pharmacy Patient Advocate Encounter  Received notification from [facility] that Prior Authorization for Qulipta 60MG  tablets has been APPROVED from 07-18-2023 to 07-17-2024   PA #/Case ID/Reference #: [id number]

## 2023-08-01 ENCOUNTER — Telehealth: Payer: Self-pay | Admitting: Pharmacy Technician

## 2023-08-01 ENCOUNTER — Telehealth: Payer: Self-pay | Admitting: Neurology

## 2023-08-01 ENCOUNTER — Other Ambulatory Visit (HOSPITAL_COMMUNITY): Payer: Self-pay

## 2023-08-01 NOTE — Telephone Encounter (Signed)
 Pharmacy Patient Advocate Encounter   Received notification from CoverMyMeds that prior authorization for UBRELVY  100MG  is required/requested.   Insurance verification completed.   The patient is insured through [facility] .   Per test claim: PA required; PA submitted to above mentioned insurance via Prompt PA Key/confirmation #/EOC [id number] Status is pending

## 2023-08-01 NOTE — Telephone Encounter (Signed)
 Pharmacy Patient Advocate Encounter  Received notification from [employer] that Prior Authorization for UBRELVY  100MG  has been APPROVED from 7.29.25 to 7.28.26. Unable to obtain price due to refill too soon rejection, last fill date 7.29.25 next available fill date8.21.25   PA #/Case ID/Reference #: [id number]

## 2023-08-01 NOTE — Telephone Encounter (Signed)
 Pt tried to get her Ubrelvy  and they need a prior auth. She is wanting to see if she can get samples so she doesn't have to keep waiting?

## 2023-08-01 NOTE — Telephone Encounter (Signed)
 PA started today.   LMOVM for patient.

## 2023-08-01 NOTE — Telephone Encounter (Signed)
 A user error has taken place: charting done on wrong patient and has been corrected.

## 2023-08-03 NOTE — Telephone Encounter (Signed)
 Patient advised of approval. She was able to pick up her ubrelvy .   Per patient she spoke to her insurance and she was advised that Mickel is covered by them and will not need a PA.  Patient want to know if that could be called into the pharmacy.

## 2023-08-04 ENCOUNTER — Other Ambulatory Visit: Payer: Self-pay | Admitting: Neurology

## 2023-08-04 MED ORDER — QULIPTA 60 MG PO TABS: 60.0000 mg | ORAL_TABLET | Freq: Every day | ORAL | 11 refills | Status: AC

## 2023-08-04 NOTE — Telephone Encounter (Signed)
 Tried calling patient per Dr.Jaffe,  I have sent prescription for Qulipta to the [facility] on [address]

## 2023-08-08 ENCOUNTER — Other Ambulatory Visit (HOSPITAL_COMMUNITY): Payer: Self-pay

## 2023-08-08 ENCOUNTER — Telehealth: Payer: Self-pay | Admitting: Pharmacy Technician

## 2023-08-08 NOTE — Telephone Encounter (Signed)
 Pharmacy Patient Advocate Encounter   Received notification from Pt Calls Messages that prior authorization for QULIPTA  60MG  is required/requested.   Insurance verification completed.   The patient is insured through [place] .   Per test claim: PA required; PA submitted to above mentioned insurance via CoverMyMeds Key/confirmation #/EOC [id number] Status is pending

## 2023-08-08 NOTE — Progress Notes (Signed)
 PA needed for [facility]

## 2023-08-08 NOTE — Telephone Encounter (Signed)
 Pharmacy Patient Advocate Encounter  Received notification from [facility] that Prior Authorization for QULIPTA  60MG  has been APPROVED from 8.5.25 to 8.5.26. Ran test claim, Copay is $0. This test claim was processed through [facility]- copay amounts may vary at other pharmacies due to pharmacy/plan contracts, or as the patient moves through the different stages of their insurance plan.   PA #/Case ID/Reference #: [id number]

## 2023-08-08 NOTE — Progress Notes (Signed)
 PA has been submitted, and telephone encounter has been created. Please see telephone encounter dated 8.5.25.

## 2023-09-19 ENCOUNTER — Ambulatory Visit: Admitting: Neurology

## 2023-09-28 ENCOUNTER — Telehealth: Payer: Self-pay | Admitting: Neurology

## 2023-09-28 MED ORDER — UBRELVY 100 MG PO TABS: 1.0000 | ORAL_TABLET | ORAL | 5 refills | Status: AC | PRN

## 2023-09-28 NOTE — Addendum Note (Signed)
 Addended by: OZELL JESUSA PARAS on: 09/28/2023 10:15 AM   Modules accepted: Orders

## 2023-09-28 NOTE — Telephone Encounter (Signed)
 Pt called in this afternoon, and she would like to get some samples for the UBRELVY  . Pt stated that she has no insurance at this time. Thanks

## 2023-09-29 NOTE — Telephone Encounter (Signed)
 Per patient she has no insurance right now and unsure when she can get insured.   Per patient she will need surgery on her eye again which has caused some more migraines over the last three months.     Advised patient to try and fill out the Assistance forms for both Qulipta  and urblevy and bring them by the office so we can fax it off. Hope fully she will qualify until she can get insurance again.

## 2023-09-29 NOTE — Telephone Encounter (Signed)
 Medication Samples have been provided to the patient.  Drug name: Ubrelvy        Strength: 100 mg        Qty: 6  LOT: [id number]  Exp.Date: 6/27  Dosing instructions: as needed  The patient has been instructed regarding the correct time, dose, and frequency of taking this medication, including desired effects and most common side effects.   Jesusa JINNY Sharper 9:01 AM 09/29/2023   Medication Samples have been provided to the patient.  Drug name: Qulipta        Strength: 60 mg        Qty: 8  LOT: [id number]  Exp.Date: 9/26,10/26  Dosing instructions: Daily  The patient has been instructed regarding the correct time, dose, and frequency of taking this medication, including desired effects and most common side effects.   Jesusa JINNY Sharper 9:01 AM 09/29/2023

## 2023-10-06 ENCOUNTER — Other Ambulatory Visit: Payer: Self-pay | Admitting: Neurology

## 2023-11-14 ENCOUNTER — Telehealth: Payer: Self-pay

## 2023-11-14 ENCOUNTER — Other Ambulatory Visit (HOSPITAL_COMMUNITY): Payer: Self-pay

## 2023-11-14 NOTE — Telephone Encounter (Signed)
 Your request has been approved Your PA request has been approved. Additional information will be provided in the approval communication. (Message 1145) Authorization Expiration11/11/2024

## 2023-11-14 NOTE — Telephone Encounter (Signed)
 Questions answered and submitted, pending determination.

## 2023-11-14 NOTE — Telephone Encounter (Signed)
*  [facility]  Pharmacy Patient Advocate Encounter   Received notification from CoverMyMeds that prior authorization for Ubrelvy  100mg  is required/requested.   Insurance verification completed.   The patient is insured through [facility].   Per test claim: PA required; PA started via CoverMyMeds. KEY [id number] . Waiting for clinical questions to populate.

## 2023-11-17 ENCOUNTER — Other Ambulatory Visit: Payer: Self-pay | Admitting: Neurology

## 2023-12-18 ENCOUNTER — Other Ambulatory Visit: Payer: Self-pay | Admitting: Neurology
